# Patient Record
Sex: Male | Born: 1955 | Race: Black or African American | Hispanic: No | Marital: Married | Smoking: Current every day smoker
Health system: Southern US, Community
[De-identification: ages and names within clinical notes are randomized; demographics above are authoritative.]

## PROBLEM LIST (undated history)

## (undated) DIAGNOSIS — I639 Cerebral infarction, unspecified: Secondary | ICD-10-CM

---

## 2008-09-19 ENCOUNTER — Inpatient Hospital Stay: Payer: Self-pay | Admitting: Internal Medicine

## 2011-09-25 ENCOUNTER — Ambulatory Visit: Payer: Self-pay | Admitting: Oncology

## 2011-10-05 ENCOUNTER — Inpatient Hospital Stay: Payer: Self-pay | Admitting: Student

## 2011-10-05 LAB — URINALYSIS, COMPLETE
Bacteria: NONE SEEN
Protein: NEGATIVE
Squamous Epithelial: <1
WBC UR: <1 /HPF (ref 0–5)

## 2011-10-05 LAB — COMPREHENSIVE METABOLIC PANEL
Alkaline Phosphatase: 97 U/L (ref 50–136)
Anion Gap: 9 (ref 7–16)
BUN: 10 mg/dL (ref 7–18)
Bilirubin,Total: 0.2 mg/dL (ref 0.2–1.0)
Chloride: 103 mmol/L (ref 98–107)
Co2: 25 mmol/L (ref 21–32)
Glucose: 112 mg/dL — ABNORMAL HIGH (ref 65–99)
Osmolality: 274 (ref 275–301)
SGOT(AST): 24 U/L (ref 15–37)
Total Protein: 7.6 g/dL (ref 6.4–8.2)

## 2011-10-05 LAB — CBC
HCT: 40 % (ref 40.0–52.0)
HGB: 12.6 g/dL — ABNORMAL LOW (ref 13.0–18.0)
MCHC: 31.4 g/dL — ABNORMAL LOW (ref 32.0–36.0)
Platelet: 280 10*3/uL (ref 150–440)
RBC: 4.99 10*6/uL (ref 4.40–5.90)
RDW: 15.9 % — ABNORMAL HIGH (ref 11.5–14.5)
WBC: 12.3 10*3/uL — ABNORMAL HIGH (ref 3.8–10.6)

## 2011-10-05 LAB — PROTIME-INR
INR: 0.9
Prothrombin Time: 12.4 secs (ref 11.5–14.7)

## 2011-10-05 LAB — TROPONIN I: Troponin-I: <0.02 ng/mL

## 2011-10-06 LAB — CBC WITH DIFFERENTIAL/PLATELET
Basophil %: 0.2 %
Eosinophil %: 0.1 %
HCT: 39.4 % — ABNORMAL LOW (ref 40.0–52.0)
Lymphocyte #: 1.7 10*3/uL (ref 1.0–3.6)
Lymphocyte %: 23.7 %
MCH: 25.9 pg — ABNORMAL LOW (ref 26.0–34.0)
MCHC: 32.7 g/dL (ref 32.0–36.0)
Monocyte #: 0.1 x10 3/mm — ABNORMAL LOW (ref 0.2–1.0)
Neutrophil #: 5.3 10*3/uL (ref 1.4–6.5)
Neutrophil %: 75.3 %
Platelet: 266 10*3/uL (ref 150–440)
RBC: 4.99 10*6/uL (ref 4.40–5.90)
RDW: 16.1 % — ABNORMAL HIGH (ref 11.5–14.5)

## 2011-10-06 LAB — COMPREHENSIVE METABOLIC PANEL
Alkaline Phosphatase: 88 U/L (ref 50–136)
Calcium, Total: 9.4 mg/dL (ref 8.5–10.1)
Chloride: 107 mmol/L (ref 98–107)
Co2: 24 mmol/L (ref 21–32)
Creatinine: 0.62 mg/dL (ref 0.60–1.30)
EGFR (Non-African Amer.): >60
Glucose: 133 mg/dL — ABNORMAL HIGH (ref 65–99)
Osmolality: 282 (ref 275–301)
Potassium: 4.1 mmol/L (ref 3.5–5.1)
SGOT(AST): 15 U/L (ref 15–37)
SGPT (ALT): 34 U/L
Sodium: 141 mmol/L (ref 136–145)
Total Protein: 7.4 g/dL (ref 6.4–8.2)

## 2011-10-06 LAB — HEMOGLOBIN A1C: Hemoglobin A1C: 6.2 % (ref 4.2–6.3)

## 2011-10-07 LAB — PROTIME-INR: Prothrombin Time: 13.9 secs (ref 11.5–14.7)

## 2011-10-07 LAB — APTT
Activated PTT: 36.6 secs — ABNORMAL HIGH (ref 23.6–35.9)
Activated PTT: 51.9 secs — ABNORMAL HIGH (ref 23.6–35.9)

## 2011-10-07 LAB — LIPID PANEL
Cholesterol: 184 mg/dL (ref 0–200)
Ldl Cholesterol, Calc: 135 mg/dL — ABNORMAL HIGH (ref 0–100)

## 2011-10-08 LAB — PLATELET COUNT: Platelet: 243 10*3/uL (ref 150–440)

## 2011-10-08 LAB — PROTIME-INR: INR: 1.1

## 2011-10-08 LAB — HEMOGLOBIN: HGB: 11.7 g/dL — ABNORMAL LOW (ref 13.0–18.0)

## 2011-10-08 LAB — APTT: Activated PTT: 100 secs — ABNORMAL HIGH (ref 23.6–35.9)

## 2011-10-09 LAB — APTT: Activated PTT: 157.5 secs — ABNORMAL HIGH (ref 23.6–35.9)

## 2011-10-26 ENCOUNTER — Ambulatory Visit: Payer: Self-pay | Admitting: Oncology

## 2013-07-20 IMAGING — CT CT HEAD WITHOUT CONTRAST
2 of 3 series · 16 of 30 positions shown, 19 images · non-contrast
Comparison: none

REASON FOR EXAM: slurred speech, right arm weakness
COMMENTS:

[Series 2: without · axial · non-contrast · 0.40mm/px · z∈[-100,-5]mm · 6 of 31 slices shown]
[im 4/31  brain]
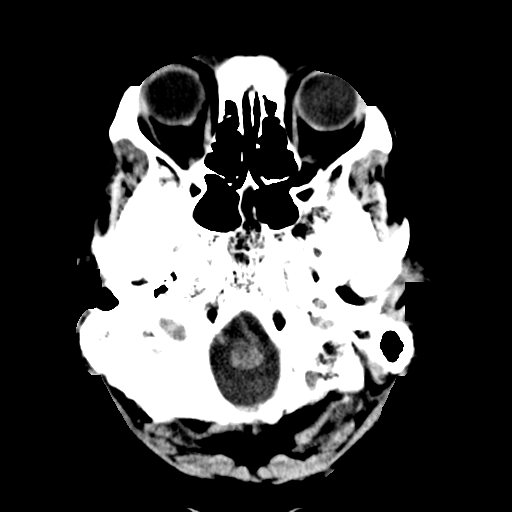
[im 8/31  brain]
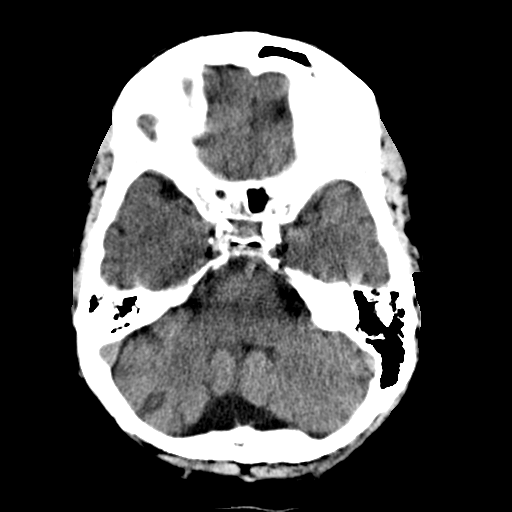
[im 12/31  brain]
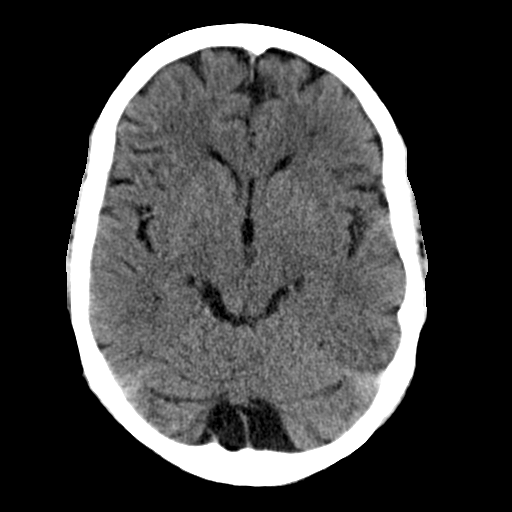
[im 16/31  brain]
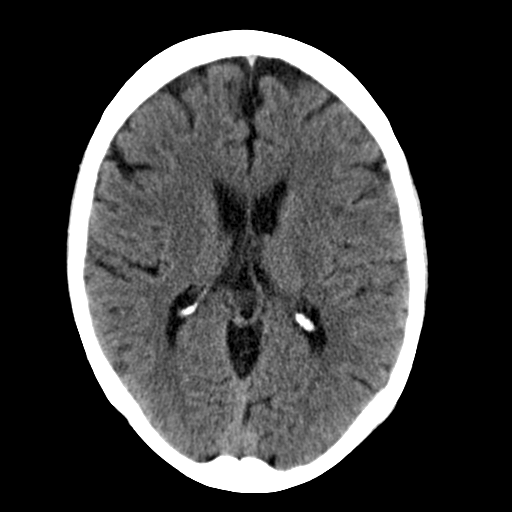
[im 19/31  brain]
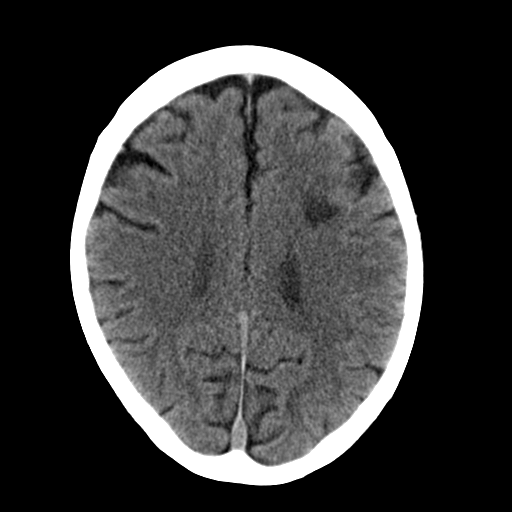
[im 23/31  brain]
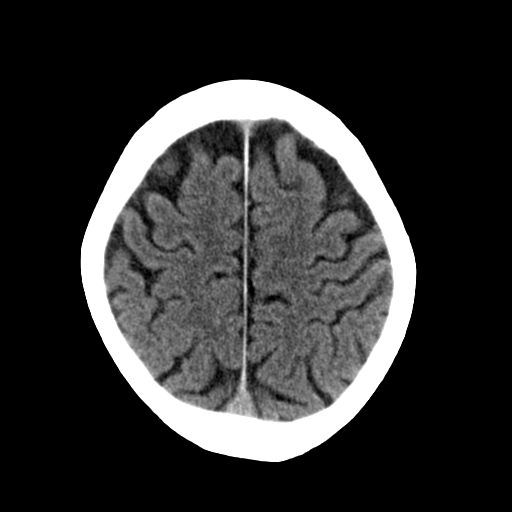

[Series 4: without 2 · axial · non-contrast · 0.40mm/px · z∈[-170,-24]mm · 10 of 39 slices shown, 13 images]
[im 4/39  brain]
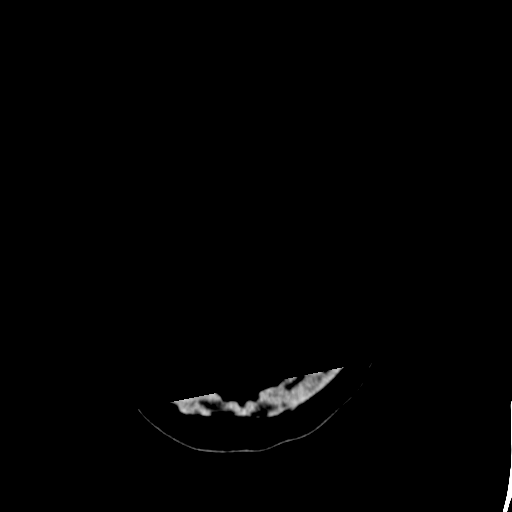
[im 4/39  bone]
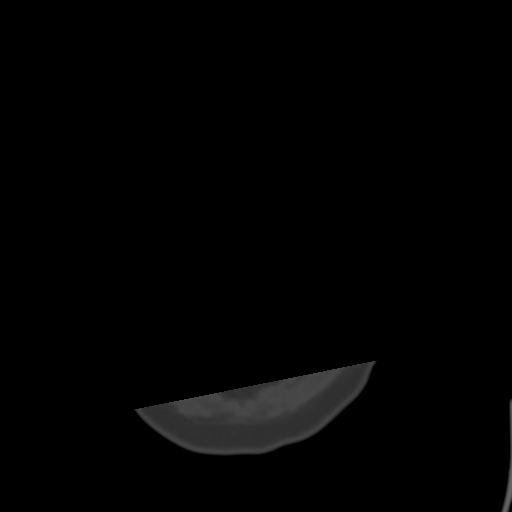
[im 7/39  brain]
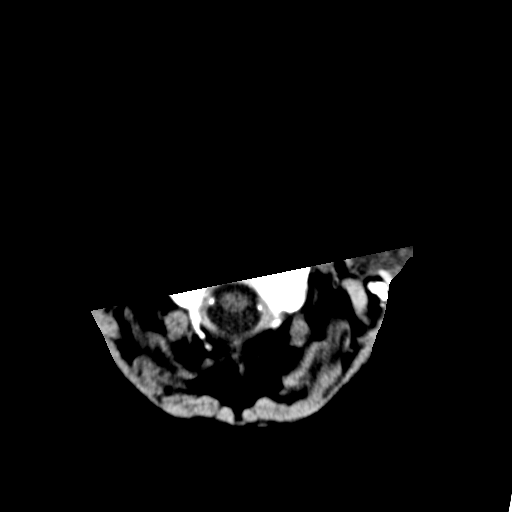
[im 11/39  brain]
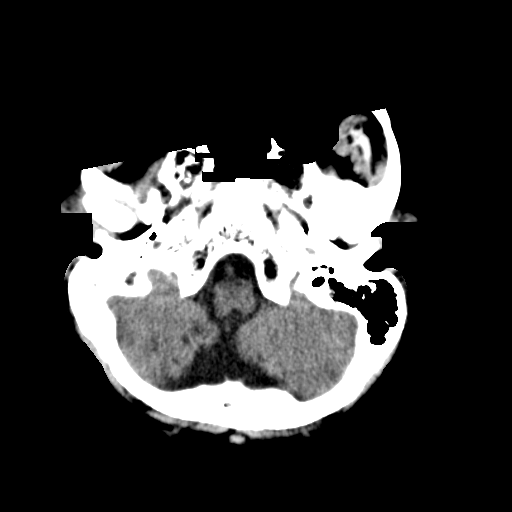
[im 14/39  brain]
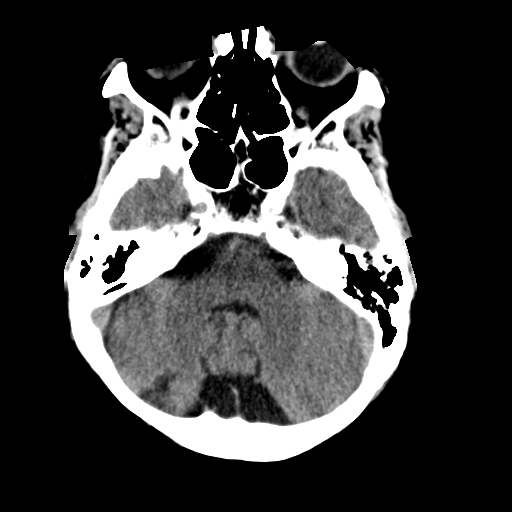
[im 18/39  brain]
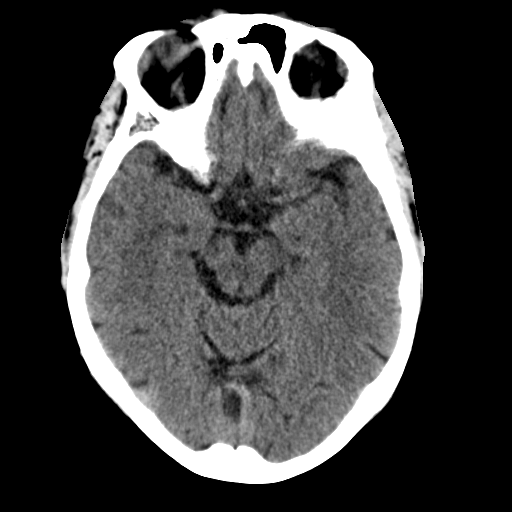
[im 18/39  bone]
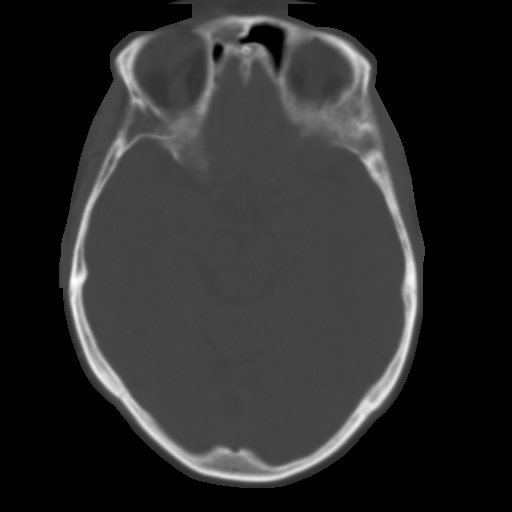
[im 21/39  brain]
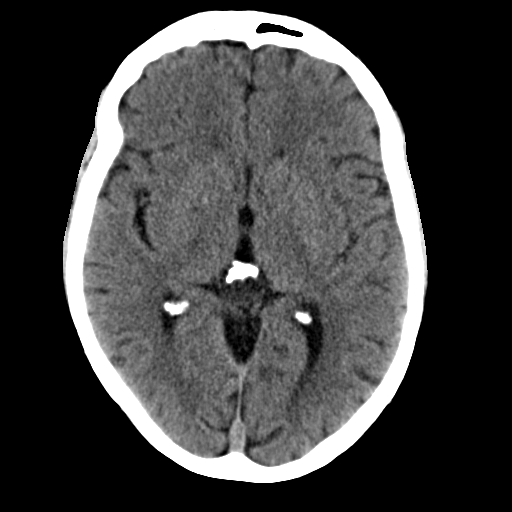
[im 25/39  brain]
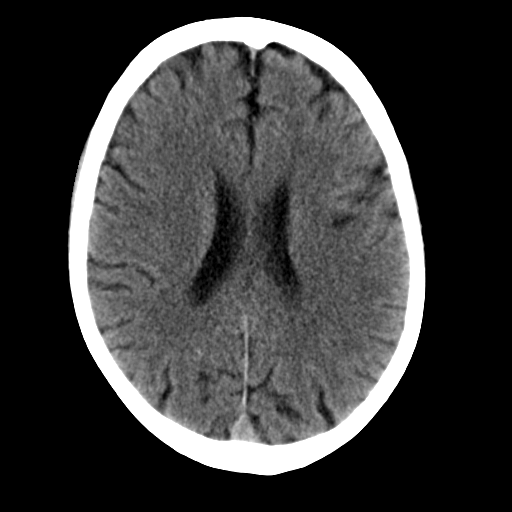
[im 28/39  brain]
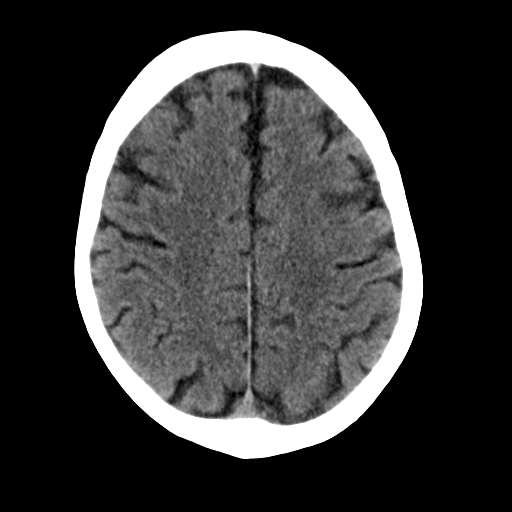
[im 32/39  brain]
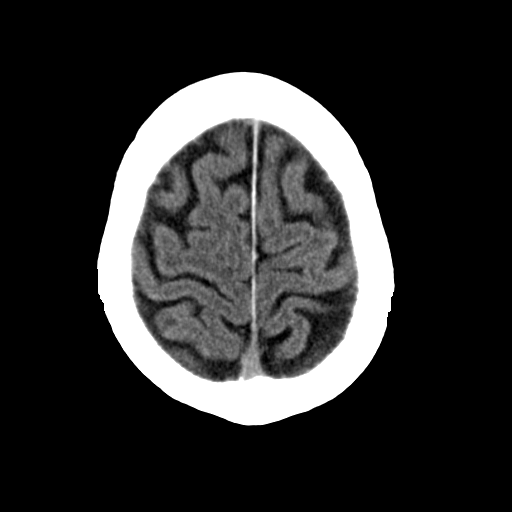
[im 32/39  bone]
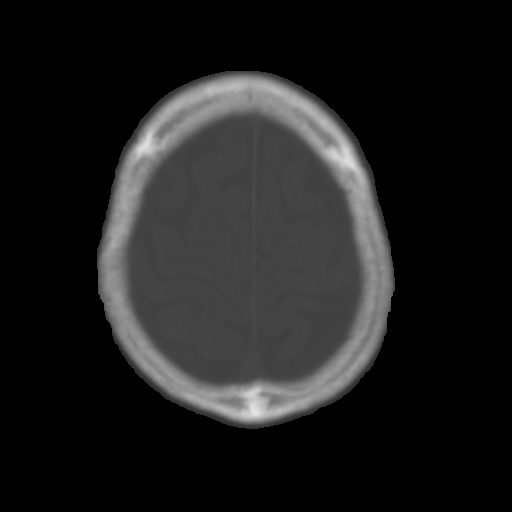
[im 35/39  brain]
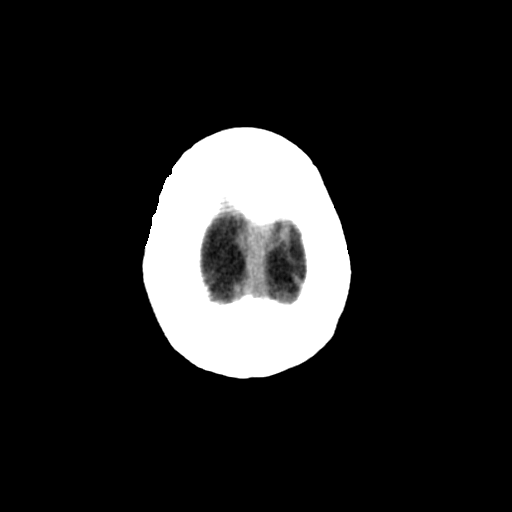

[16 of 30 positions shown; findings below may reference images not displayed]

PROCEDURE:     CT  - CT HEAD WITHOUT CONTRAST  - October 05, 2011  [DATE]

RESULT:     Emergent noncontrast CT of the brain is performed. There is no
previous exam for comparison.

There is an abnormal appearance in the left frontal region posteriorly
between image 17 and 20 concerning for underlying mass with edema. Minimal
hyperdensity is seen. Malignancy is not excluded. Definite hemorrhage is not
appreciated. There is no Max midline shift evident. The ventricles and sulci
otherwise appear normal. The sinuses and mastoid air cells show normal
aeration of the sinuses and left mastoid with some sclerosis of chronic
nature in the right mastoid. The calvarium appears intact.
IMPRESSION: 1. Findings concerning for left frontal tumor with surrounding edema. No
midline shift, definite hemorrhage or hydrocephalus evident.

[REDACTED]

## 2014-09-18 NOTE — Discharge Summary (Signed)
PATIENT NAME:  Mark Fleming, Mark Fleming MR#:  [id number] DATE OF BIRTH:  September 22, 1955  DATE OF ADMISSION:  10/05/2011 DATE OF DISCHARGE:  10/09/2011  PRIMARY CARE PHYSICIAN: None.   CONSULTANTS:  1. Dr. Grayland Ormond from [facility]. 2. Dr. Lucky Cowboy, vascular surgery.  3. Physical therapy.   CHIEF COMPLAINT: Confusion and inability to communicate.   DISCHARGE DIAGNOSES:  1. Expressive aphasia in setting of acute cerebrovascular accident in setting of left internal carotid artery stenosis.  2. Tobacco abuse.  3. History of prostate cancer diagnosed about eight years ago.  4. History of distant alcohol abuse.   DISCHARGE MEDICATIONS:  1. Warfarin 4 mg daily.  2. Simvastatin 20 mg daily.   DISPOSITION: Home.   ACTIVITY: As tolerated.   DIET: Low sodium, ADA diet.   FOLLOW UP: [facility] follow up with [facility] for [facility] check on Friday, 10/11/2011, and INR goal should be between 2 and 3. Please follow up with vascular surgery in 4 to 6 weeks for your carotid artery stenosis ongoing monitoring and follow and if there is further symptoms please call 911.   CODE STATUS: Patient is FULL CODE.   HISTORY OF PRESENT ILLNESS: For full details, please see the history and physical dictated on 10/05/2011 by Dr. Ether Griffins, but briefly this is a 59 year old African American male who has not seen a doctor for five years presents after having difficulty expressing himself. Initial CT of the head had shown left frontal area possible mass and patient was admitted to the hospitalist service for work-up for this mass. Patient apparently has history of prostate cancer. An MRI of the brain was ordered as well as patient was given Decadron and admitted to the hospitalist service.   LABORATORY, DIAGNOSTIC AND RADIOLOGICAL DATA: Initial creatinine 0.79, glucose 112, BUN 10. LFTs within normal and limits. Troponin negative, hemoglobin 12.6. WBC 12.3, hematocrit 40, platelets 280. INR 0.9. Urinalysis negative. PSA is elevated at 9.4. INR  by discharge today was 2. Occult stool negative. Echocardiogram showing LV systolic function of 97%, LA is mildly dilated as well as right atrium, mild to moderate mitral regurgitation and tricuspid regurgitation. No apparent source for the cerebrovascular accident. CT of the head without contrast on arrival showing findings concerning for left frontal tumor with surrounding edema, no midline shift. Definitive hemorrhage or hydrocephalus evident. CT of the chest, abdomen and pelvis with contrast on 05/12 shows cholelithiasis, dependent atelectasis in both lower lobes and atherosclerotic disease. CT angio of the neck on 05/13 shows complete occlusion of the majority of left internal carotid artery. No hemodynamically significant stenosis in the right common or internal carotid arteries. X-ray of the chest, PA and lateral on arrival: No acute cardiopulmonary disease. MRI of the brain with and without contrast on 05/12 had shown findings suggesting multifocal infarct in the left frontal and parietal regions. There are also changes of what is likely chronic small vessel disease. No midline shift. No definitive hemorrhage. A definitive enhancing mass is not appreciated. Ultrasound of the carotids on 05/12 shows finding concerning for high grade stenosis and subtotal occlusion in the left internal carotid artery.   HOSPITAL COURSE: Patient was admitted to the hospitalist service for further brain mass work-up and started on Decadron, Keppra and oncology consult was requested. MRI was ordered. Patient had no significant elevation of the blood pressure. Patient was noted to have expressive aphasia and some confusion, however, otherwise neurologically intact. The MRI of the brain did not show any masses, however, did show multiple infarcts.  Speech therapy as well as physical therapy was consulted and Decadron as well as Keppra was discontinued and vascular surgery was consulted as patient had significant carotid artery  stenosis as described above and ultrasound and work-up for the CVA. He was also put on the tele monitor and echocardiogram was obtained. Both of them were negative for acute sources of the CVA. The likely source is the carotid artery stenosis. Per vascular he needs six months of anticoagulation followed by antiplatelets and close monitoring of the carotid system bilaterally. His LDL is above goal and he was therefore started on a statin. He was started on heparin drip and Coumadin. Currently his INR is 2 and pharmacy has been dosing the Coumadin. He was on 10 mg of Coumadin and now as INR jumped from low 1s to 2 the dose was then decreased to 4. Patient aware that he should have INR checked on Friday and his INR goal should be between 2 and 3 and needs ongoing monitoring of his blood count. He also is aware that if he has any further symptoms he is to call 911. He has significantly elevated risk of recurrent strokes and he is aware of that as well.   He was seen by physical therapy and their recommendation is home. He will be going home with outpatient follow up at the [place]. In regards to his PCA, PCA is elevated and needs outpatient follow up as well and he was also started on a nicotine patch here. His hemoglobin A1c is 6.1. He had mild leukocytosis which did trend down and did not need any further work-up for that. At this point he will be discharged with above follow up.   DISPOSITION: Home.   TOTAL TIME SPENT: 40 minutes.   CODE STATUS: Patient is FULL CODE.  ____________________________ Vivien Presto, MD sa:cms D: 10/09/2011 11:40:56 ET T: 10/09/2011 12:40:02 ET JOB#: [id number]  cc: Vivien Presto, MD, <Dictator>  Vivien Presto MD ELECTRONICALLY SIGNED 10/11/2011 16:32

## 2014-09-18 NOTE — H&P (Signed)
PATIENT NAME:  Mark Fleming, Mark Fleming MR#:  [id number] DATE OF BIRTH:  1955-10-09  DATE OF ADMISSION:  10/05/2011  ADDENDUM:  The patient mentioned right lower extremity cramping. It is very unclear why he would be having these cramps. However, I am concerned about possible partial seizures and I will be initiating the patient on Keppra starting now, 500 mg twice daily dose. The patient may benefit from electroencephalographic evaluation in the future.    ____________________________ Theodoro Grist, MD rv:bjt D: 10/05/2011 22:56:36 ET T: 10/06/2011 09:48:42 ET JOB#: [id number]  cc: Theodoro Grist, MD, <Dictator> Makynzi Eastland MD ELECTRONICALLY SIGNED 10/26/2011 15:16

## 2014-09-18 NOTE — Consult Note (Signed)
PATIENT NAME:  Mark Fleming, Mark Fleming MR#:  [id number] DATE OF BIRTH:  February 26, 1956  DATE OF CONSULTATION:  10/07/2011  CONSULTING PHYSICIAN:  Algernon Huxley, MD  REASON FOR CONSULTATION: Carotid disease and stroke.  HISTORY OF PRESENT ILLNESS: The patient is a 59 year old African American male with history prostate cancer who was admitted with aphasia, some right-sided pain and weakness, confusion, and was initially felt to have potential brain mass.  During his work-up, he has had an MRI of the brain which did not reveal a brain mass but instead multiple areas of infarct in the left hemisphere. This was followed by an ultrasound which suggested a near occlusive stenosis in the left internal carotid artery. He was started on heparin last night when these findings returned and has resultant improvement in his aphasia, although not complete resolution. He does not have appreciable weakness in his right side at this point, and his mental status is improved. A CT angiogram was performed this morning which I have independently reviewed. This clearly suggests occlusion of the left internal carotid artery. There is minimal disease in the right common carotid artery present.   PAST MEDICAL HISTORY: Significant for: 1. Prostate cancer eight years ago.  2. Tobacco abuse.  3. Right foot abscesses.  4. History of alcohol abuse.   PAST SURGICAL HISTORY: He had some sort of implant for prostate cancer, and that is apparently it. He did not have prostatectomy.   MEDICATIONS: None on admission.   ALLERGIES: No known drug allergies.   FAMILY HISTORY: Negative for coronary artery disease or stroke.   SOCIAL HISTORY: Ongoing tobacco abuse of 1 to 2 packs per day. He quit alcohol approximately five years ago but had drank heavily prior to that. He is currently unemployed.  REVIEW OF SYSTEMS: CONSTITUTIONAL: Some weight gain over the past several years. No fevers or chills. EYES: He had some blurry vision and visual  changes with this event. EARS: No tinnitus or ear pain. CARDIOVASCULAR: No chest pain or palpitations. RESPIRATORY: No shortness of breath or cough. GASTROINTESTINAL: No nausea, vomiting, diarrhea. GENITOURINARY: No dysuria or hematuria. ENDOCRINE: No heat or cold intolerance. HEMATOLOGIC: No anemia or easy bruising. SKIN: No new rash or ulcers. NEUROLOGICAL:  As per history of present illness. PSYCHIATRIC: No anxiety or depression.   PHYSICAL EXAMINATION:  GENERAL: This is a well-developed, well-nourished [place] American male, sitting in his bed, not in apparent distress. He has mild aphasia but this is not severe, and I did not appreciate any facial droop.   VITAL SIGNS: Temperature 98, pulse 78, blood pressure 102/62, saturations are 96% on room air.   HEENT: Head: Normocephalic and atraumatic. Eyes: Sclerae are anicteric. Conjunctivae are clear. Ears: Normal external appearance. Hearing is intact.   NECK: Neck is supple without adenopathy or JVD. Carotids: I do not appreciate any bruits on exam today.   HEART: Regular rate and rhythm.   LUNGS: Clear to auscultation bilaterally.   ABDOMEN: Soft, nondistended, nontender.   EXTREMITIES: He did not have any appreciable strength deficits or sensation deficits of the upper or lower extremities that I can tell on exam today.   NEUROLOGICAL: Strength and tone are intact. He does have some mild aphasia present. Mental status seems reasonably good, and he is alert and oriented.   PSYCHIATRIC: Normal affect and mood.     LABORATORY, DIAGNOSTIC AND RADIOLOGICAL DATA:  INR 1.0. Total cholesterol is 184 with an HDL of 32 and an LDL of 135.  Sodium was 141, potassium  4.1, chloride 107, CO2 24, BUN 10, creatinine 0.62 glucose is 133. White blood cell count is 7.0, hemoglobin 12.9, platelet count 266,000. Imaging studies as described above.   ASSESSMENT AND PLAN: The patient is a 59 year old African American male with acute stroke secondary to a  left carotid artery occlusion. This is likely an acute occlusion, and I would recommend treatment with full anticoagulation for six months. This could be transitioned from heparin to Coumadin or Lovenox to Coumadin depending on the primary service's preference and the patient's comfort level with home injections. At six months, we would likely transition him to antiplatelets. He will need ongoing Vascular followup to follow his right carotid artery, which has minimal disease at present. There is no acute surgical or interventional procedure that is likely to be of benefit for his acute left carotid artery occlusion at this point. This was discussed with the patient and his wife, who voiced their understanding. I will plan on seeing him in the office in 4 to 6 weeks in followup and arranging long-term carotid surveillance. He will need his primary care physician to follow his anticoagulation status.   This is a level-4 consultation.   ____________________________ Algernon Huxley, MD jsd:cbb D: 10/07/2011 15:27:11 ET T: 10/08/2011 08:47:05 ET JOB#: [id number]  cc: Algernon Huxley, MD, <Dictator> Algernon Huxley MD ELECTRONICALLY SIGNED 10/15/2011 12:10

## 2014-09-18 NOTE — Consult Note (Signed)
Asked to see patient for left hemispheric stroke symptoms and carotid disease.  Admitted with aphasia, confusion, weakness.  This has improved but not entirely resolved with heparin started last night.  Initially there was concern for a mass lesion, but MRI showed stroke and not a mass.  His duplex showed what was reported as a near occlusion of the left ICA.  CT angiogram performed this morning which I have independently reviewed.  This clearly shows the left ICA to be occluded.  treatment for symptomatic acute ICA occlusion is with anti-coagulation alone.  There is basically no role for surgical or interventional therapy at this point.  his right carotid has minimal disease.  Will arrange outpatient follow up in 4-6 weeks with planned duplex follow up in about 6 months.  After 6 months of coumadin, will use anti-platelets.    Electronic Signatures: Algernon Huxley (MD)  (Signed on 13-May-13 14:24)  Authored  Last Updated: 13-May-13 14:24 by Algernon Huxley (MD)

## 2014-09-18 NOTE — Consult Note (Signed)
History of Present Illness:   Reason for Consult Brain mass, suspicious for malignancy.    HPI   Patient is a 59 year old male with no significant past medical history who was in his usual state of health until several days ago when her his wife he could no longer communicate intelligently to her.  He denies any pain.  He denies any focal symptoms or weakness.  He has no other neurologic complaints.  He has had no recent fevers or illnesses. He denies any chest pain, cough, hemoptysis, or shortness of breath.  He is good appetite and denies weight loss.  He has no nausea, vomiting, constipation, or diarrhea.  He has no melena or hematochezia.  He has no urologic complaints.  Patient otherwise feels well and offers no further specific complaints.  PFSH:   Additional Past Medical and Surgical History Past medical history: Diabetes, prostate cancer.  Past surgical history: Negative.  Family history: Negative and noncontributory.  Social history: Positive tobacco, one pack per day x25 years, former alcohol none currently though.   Review of Systems:   Performance Status (ECOG) 0    Review of Systems   As per HPI. Otherwise, 10 point system review was negative.   NURSING NOTES: **Vital Signs.:   12-May-13 05:29    Vital Signs Type: Routine    Temperature Temperature (F): 97.9    Celsius: 36.6    Temperature Source: oral    Pulse Pulse: 80    Pulse source: per Dinamap    Respirations Respirations: 18    Systolic BP Systolic BP: 409    Diastolic BP (mmHg) Diastolic BP (mmHg): 71    Mean BP: 86    BP Source: Dinamap    Pulse Ox % Pulse Ox %: 93    Pulse Ox Activity Level: At rest    Oxygen Delivery: Room Air/ 21 %   Physical Exam:   Physical Exam General: Well-developed, well-nourished, no acute distress. Eyes: Pink conjunctiva, anicteric sclera. HEENT: Normocephalic, moist mucous membranes, clear oropharnyx. Lungs: Clear to auscultation bilaterally. Heart:  Regular rate and rhythm. No rubs, murmurs, or gallops. Abdomen: Soft, nontender, nondistended. No organomegaly noted, normoactive bowel sounds. Musculoskeletal: No edema, cyanosis, or clubbing. Neuro: Alert, answering all questions appropriately. Cranial nerves grossly intact. Skin: No rashes or petechiae noted. Psych: Normal affect. Lymphatics: No cervical, calvicular, axillary or inguinal LAD.    No Known Allergies:   Routine Hem:  12-May-13 04:50    WBC (CBC) 7.0   RBC (CBC) 4.99   Hemoglobin (CBC) 12.9   Hematocrit (CBC) 39.4   Platelet Count (CBC) 266   MCV 79   MCH 25.9   MCHC 32.7   RDW 16.1  Routine Chem:  12-May-13 04:50    Glucose, Serum 133   BUN 10   Creatinine (comp) 0.62   Sodium, Serum 141   Potassium, Serum 4.1   Chloride, Serum 107   CO2, Serum 24   Calcium (Total), Serum 9.4  Hepatic:  12-May-13 04:50    Bilirubin, Total 0.3   Alkaline Phosphatase 88   SGPT (ALT) 34   SGOT (AST) 15   Total Protein, Serum 7.4   Albumin, Serum 3.6  Routine Chem:  12-May-13 04:50    Osmolality (calc) 282   eGFR (African American) >60   eGFR (Non-African American) >60   Anion Gap 10  Routine Hem:  12-May-13 04:50    Neutrophil % 75.3   Lymphocyte % 23.7   Monocyte % 0.7   Eosinophil %  0.1   Basophil % 0.2   Neutrophil # 5.3   Lymphocyte # 1.7   Monocyte # 0.1   Eosinophil # 0.0   Basophil # 0.0   Assessment and Plan:  Impression:   Brain mass, highly suspicious for malignancy.  Plan:   1.  Brain mass: Unclear if this is the primary tumor or possibly metastatic disease.  Have ordered a CT of the chest, abdomen, and pelvis for further investigation.  Continue Decadron 10 mg IV every 6 hours.  If this is primary brain, patient can have outpatient evaluation with Dr. Alric Seton later this week.  If a primary is found on CT scan, he will likely require a CT-guided biopsy for diagnosis. consult, will follow.  Electronic Signatures: Delight Hoh (MD)   (Signed 12-May-13 10:05)  Authored: HISTORY OF PRESENT ILLNESS, PFSH, ROS, NURSING NOTES, PE, ALLERGIES, LABS, ASSESSMENT AND PLAN   Last Updated: 12-May-13 10:05 by Delight Hoh (MD)

## 2014-09-18 NOTE — H&P (Signed)
PATIENT NAME:  Mark Fleming, Mark Fleming MR#:  [id number] DATE OF BIRTH:  Apr 23, 1956  DATE OF ADMISSION:  10/05/2011  PRIMARY CARE PHYSICIAN: None    HISTORY OF PRESENT ILLNESS: Patient is a 59 year old African American male with past medical history significant for history of prostate carcinoma status post some therapy eight years ago, however, did not finish his therapy, but no surgery was done, history of tobacco and alcohol abuse presented to the hospital with complaints of difficulty expressing himself. According to patient's wife as well as patient himself over the past few days he has been having problems expressing himself. He knows what he wants to say but he is not able to communicate. He also seemed to be confused. He also admits of having some cramping in his right lower extremity. No other symptoms were noted. No weakness, numbness or any other symptoms. He denied any visual symptoms and denied any swelling problems. He had CT scan here in the Emergency Room which showed left frontal area possible mass and hospitalist services were contacted for admission.   PAST MEDICAL HISTORY:  1. History of prostate carcinoma diagnosed approximately eight years ago, received some therapy, unknown, but no surgeries were performed. 2. Ongoing tobacco abuse. 3. History of alcohol abuse in the past.  4. History of right foot abscess and admission for the same in April 2010.    MEDICATIONS: None.   PAST SURGICAL HISTORY: None. Apparently patient received some upper extremity implant for prostate carcinoma.   ALLERGIES: No known drug allergies.   FAMILY HISTORY: Negative for coronary artery disease or myocardial infarction.   SOCIAL HISTORY: Ongoing tobacco abuse, 1 to 2 packs a day for the past close to 40 years. Denies any alcohol, quit approximately five years ago. He used to drink heavy alcohol in the past. No illicit drugs. Lives in [place] with his wife. Has one child. Is unemployed.    REVIEW OF  SYSTEMS: CONSTITUTIONAL: Positive for weight gain, approximately 20 pounds since at least 10 years ago, now seemed to be exacerbating over the past six months. Quite good p.o. intake. Right eye blurring of vision, patient admits of that, otherwise denies any other symptoms. A couple of time he had bloodshot in his right eye. Otherwise denies fevers, chills, fatigue, weakness, weight loss. Admits of gain. EYES: In regards to eyes denies any blurry vision, double vision, glaucoma, cataracts. ENT: Denies any tinnitus, allergies, epistaxis, sinus pain, dentures, difficulty swallowing. RESPIRATORY: Denies any cough, wheezing, asthma, chronic obstructive pulmonary disease. CARDIOVASCULAR: Denies chest pain, orthopnea, edema, arrhythmia, palpitations or syncope. GASTROINTESTINAL: Denies nausea, vomiting, diarrhea, constipation. GENITOURINARY: Denies dysuria, hematuria, frequency, incontinence. ENDOCRINOLOGY: Denies any polydipsia, nocturia, thyroid problems, heat or cold intolerance, or thirst. HEME: Denies any anemia, easy bruising, bleeding, swollen glands. SKIN: Denies any acne, rash, lesions, change in moles. MUSCULOSKELETAL: Denies arthritis, cramps, swelling, gout. NEUROLOGICAL: Denies any numbness, epilepsy, tremor. PSYCH: Denies anxiety, insomnia, depression.     PHYSICAL EXAMINATION:  VITAL SIGNS: On arrival to the hospital patient's vitals: Temperature 98, pulse rate 96, respiration rate 22, blood pressure 160/96, saturation 96% on room air.   GENERAL: This is a well-nourished African American male in no significant distress, somewhat discouraged and tearful sitting on the stretcher.   HEENT: Pupils are equal, reactive to light. Extraocular movements are intact. No icterus or conjunctivitis. Has normal hearing. No pharyngeal erythema. Mucosa is moist.   NECK: Neck did not reveal any masses. Supple, nontender. Thyroid not enlarged. No adenopathy. No JVD or carotid  bruits bilaterally. Full range of motion.    LUNGS: Clear to auscultation in all fields. A few rhonchi were heard, but there was no wheezing, no labored inspirations, increased effort. Somewhat diminished breath sounds but no dullness to percussion, not in overt respiratory distress.   CARDIOVASCULAR: S1, S2 appreciated. No murmurs, gallops, rubs noted. PMI not lateralized. Chest is nontender to palpation.   EXTREMITIES: 1+ pedal pulses. No lower extremity edema, calf tenderness, or cyanosis was noted.   ABDOMEN: Soft, nontender. Bowel sounds are present. No hepatosplenomegaly or masses were noted.   RECTAL: Deferred.   MUSCULOSKELETAL: Able in all extremities. No cyanosis, degenerative joint disease, or kyphosis. Gait is not tested.   SKIN: Skin did not reveal any rash, lesions, erythema, nodularity, induration. It was warm and dry to palpation.   LYMPH: No adenopathy in cervical region.   NEUROLOGICAL: Cranial nerves grossly intact. Patient did have expressive aphasia. He is alert, oriented to person and place. He is cooperative, however, has difficulty struggling with finding words and he is getting frustrated whenever the words are not coming out. Memory is not impaired. No confusion, agitation, or depression noted.   LABORATORY, DIAGNOSTIC, AND RADIOLOGICAL DATA: BMP showed glucose 112, otherwise unremarkable study. Liver enzymes were normal. Troponin less than 0.02. White blood cell count is elevated to 12.3, hemoglobin 12.6, platelet count 280. Coagulation panel unremarkable. Urinalysis: Straw clear urine, negative for glucose, bilirubin or ketones, specific gravity 1.006, pH 6.0, negative for blood, protein, nitrites or leukocyte esterase, less than 1 red blood cell as well as white blood cells, no bacteria, less than 1 epithelial cell was noted. CT scan of head without contrast showed findings concerning for left frontal tumor with surrounding edema. No midline shift, evident hemorrhage or hydrocephalus noted.   ASSESSMENT AND  PLAN:  1. Brain mass. Admit patient to medical floor. Get MRI of brain. Get chest x-ray. Start patient on Decadron, get oncology consultation.  2. History of prostate carcinoma. Get PSA.  3. Tobacco abuse. Discussed cessation for five minutes. Nicotine replacement will be initiated.  4. Anemia. Get guaiac.  5. Hyperglycemia. Get hemoglobin A1c. Start patient on Apidra since he is going to be on Decadron now.  6. Leukocytosis. No signs of infection, possibly stress related.  7. Elevated blood pressure with no diagnosed hypertension, likely stress related. Follow and initiate blood pressure medications if needed.   TIME SPENT: 50 minutes.   ____________________________ Theodoro Grist, MD rv:cms D: 10/05/2011 22:43:27 ET T: 10/06/2011 09:57:29 ET JOB#: [id number]  cc: Theodoro Grist, MD, <Dictator> Chrissie Dacquisto MD ELECTRONICALLY SIGNED 10/26/2011 15:20

## 2018-02-12 ENCOUNTER — Ambulatory Visit: Payer: Self-pay | Admitting: Podiatry

## 2018-02-19 ENCOUNTER — Ambulatory Visit (INDEPENDENT_AMBULATORY_CARE_PROVIDER_SITE_OTHER): Payer: No Typology Code available for payment source | Admitting: Podiatry

## 2018-02-19 ENCOUNTER — Encounter: Payer: Self-pay | Admitting: Podiatry

## 2018-02-19 DIAGNOSIS — E1142 Type 2 diabetes mellitus with diabetic polyneuropathy: Secondary | ICD-10-CM | POA: Diagnosis not present

## 2018-02-19 DIAGNOSIS — E1159 Type 2 diabetes mellitus with other circulatory complications: Secondary | ICD-10-CM | POA: Diagnosis not present

## 2018-02-19 DIAGNOSIS — B351 Tinea unguium: Secondary | ICD-10-CM | POA: Diagnosis not present

## 2018-02-19 DIAGNOSIS — M79674 Pain in right toe(s): Secondary | ICD-10-CM

## 2018-02-19 DIAGNOSIS — M79675 Pain in left toe(s): Secondary | ICD-10-CM

## 2018-02-19 NOTE — Progress Notes (Signed)
This patient presents to the office with chief complaint of long thick nails and diabetic feet.  This patient  says there  is  no pain and discomfort in his  feet.  This patient says there are long thick painful nails. Patient has not have nails done in years.  These nails are painful walking and wearing shoes.  Patient has no history of infection or drainage from both feet.  Patient is unable to  self treat his own nails . This patient presents  to the office today for treatment of the  long nails and a foot evaluation due to history of  diabetes.  General Appearance  Alert, conversant and in no acute stress.  Vascular  Dorsalis pedis and posterior tibial  pulses are not  palpable  bilaterally.  Capillary return is within normal limits  bilaterally. Cold feet.  bilaterally.  Neurologic  Senn-Weinstein monofilament wire test diminished   bilaterally. Muscle power within normal limits bilaterally.  Nails Thick disfigured discolored nails with subungual debris  from hallux to fifth toes bilaterally. No evidence of bacterial infection or drainage bilaterally.  Orthopedic  No limitations of motion of motion feet .  No crepitus or effusions noted.  No bony pathology or digital deformities noted.  Skin  normotropic skin with no porokeratosis noted bilaterally.  No signs of infections or ulcers noted.     Onychomycosis  Diabetes with no foot complications  IE  Debride nails x 10.  A diabetic foot exam was performed and there is  evidence of any vascular or neurologic pathology.   RTC 3 months.   Gardiner Barefoot DPM

## 2018-04-22 ENCOUNTER — Telehealth: Payer: Self-pay | Admitting: Podiatry

## 2018-04-22 NOTE — Telephone Encounter (Signed)
[place] would like to have records faxed to [phone] Attn: Danae Chen

## 2018-05-18 ENCOUNTER — Encounter: Payer: Self-pay | Admitting: Podiatry

## 2018-05-18 ENCOUNTER — Ambulatory Visit (INDEPENDENT_AMBULATORY_CARE_PROVIDER_SITE_OTHER): Payer: No Typology Code available for payment source | Admitting: Podiatry

## 2018-05-18 DIAGNOSIS — E1159 Type 2 diabetes mellitus with other circulatory complications: Secondary | ICD-10-CM | POA: Diagnosis not present

## 2018-05-18 DIAGNOSIS — M79674 Pain in right toe(s): Secondary | ICD-10-CM | POA: Diagnosis not present

## 2018-05-18 DIAGNOSIS — B351 Tinea unguium: Secondary | ICD-10-CM | POA: Diagnosis not present

## 2018-05-18 DIAGNOSIS — M79675 Pain in left toe(s): Secondary | ICD-10-CM

## 2018-05-18 NOTE — Progress Notes (Signed)
Complaint:  Visit Type: Patient returns to my office for continued preventative foot care services. Complaint: Patient states" my nails have grown long and thick and become painful to walk and wear shoes" Patient has been diagnosed with DM with no foot complications. The patient presents for preventative foot care services. No changes to ROS  Podiatric Exam: Vascular: dorsalis pedis and posterior tibial pulses are palpable bilateral. Capillary return is immediate. Temperature gradient is WNL. Skin turgor WNL  Sensorium: Normal Semmes Weinstein monofilament test. Normal tactile sensation bilaterally. Nail Exam: Pt has thick disfigured discolored nails with subungual debris noted bilateral entire nail hallux through fifth toenails Ulcer Exam: There is no evidence of ulcer or pre-ulcerative changes or infection. Orthopedic Exam: Muscle tone and strength are WNL. No limitations in general ROM. No crepitus or effusions noted. Foot type and digits show no abnormalities. Bony prominences are unremarkable. Skin: No Porokeratosis. No infection or ulcers  Diagnosis:  Onychomycosis, , Pain in right toe, pain in left toes  Treatment & Plan Procedures and Treatment: Consent by patient was obtained for treatment procedures.   Debridement of mycotic and hypertrophic toenails, 1 through 5 bilateral and clearing of subungual debris. No ulceration, no infection noted.  Return Visit-Office Procedure: Patient instructed to return to the office for a follow up visit 3 months for continued evaluation and treatment.    Kashawna Manzer DPM 

## 2018-08-17 ENCOUNTER — Ambulatory Visit: Payer: Non-veteran care | Admitting: Podiatry

## 2018-10-22 ENCOUNTER — Ambulatory Visit: Payer: Non-veteran care | Admitting: Podiatry

## 2018-11-02 ENCOUNTER — Encounter: Payer: Self-pay | Admitting: Podiatry

## 2018-11-02 ENCOUNTER — Other Ambulatory Visit: Payer: Self-pay

## 2018-11-02 ENCOUNTER — Ambulatory Visit (INDEPENDENT_AMBULATORY_CARE_PROVIDER_SITE_OTHER): Payer: No Typology Code available for payment source | Admitting: Podiatry

## 2018-11-02 VITALS — Temp 97.7°F

## 2018-11-02 DIAGNOSIS — M79675 Pain in left toe(s): Secondary | ICD-10-CM

## 2018-11-02 DIAGNOSIS — M79674 Pain in right toe(s): Secondary | ICD-10-CM | POA: Diagnosis not present

## 2018-11-02 DIAGNOSIS — B351 Tinea unguium: Secondary | ICD-10-CM

## 2018-11-02 NOTE — Progress Notes (Signed)
Complaint:  Visit Type: Patient returns to my office for continued preventative foot care services. Complaint: Patient states" my nails have grown long and thick and become painful to walk and wear shoes" . The patient presents for preventative foot care services. No changes to ROS  Podiatric Exam: Vascular: dorsalis pedis and posterior tibial pulses are palpable bilateral. Capillary return is immediate. Temperature gradient is WNL. Skin turgor WNL  Sensorium: Normal Semmes Weinstein monofilament test. Normal tactile sensation bilaterally. Nail Exam: Pt has thick disfigured discolored nails with subungual debris noted bilateral entire nail hallux through fifth toenails Ulcer Exam: There is no evidence of ulcer or pre-ulcerative changes or infection. Orthopedic Exam: Muscle tone and strength are WNL. No limitations in general ROM. No crepitus or effusions noted. Foot type and digits show no abnormalities. Bony prominences are unremarkable. Skin: No Porokeratosis. No infection or ulcers  Diagnosis:  Onychomycosis, , Pain in right toe, pain in left toes  Treatment & Plan Procedures and Treatment: Consent by patient was obtained for treatment procedures.   Debridement of mycotic and hypertrophic toenails, 1 through 5 bilateral and clearing of subungual debris. No ulceration, no infection noted.  Return Visit-Office Procedure: Patient instructed to return to the office for a follow up visit 3 months for continued evaluation and treatment.    Verona Hartshorn DPM 

## 2019-02-04 ENCOUNTER — Ambulatory Visit (INDEPENDENT_AMBULATORY_CARE_PROVIDER_SITE_OTHER): Payer: Non-veteran care | Admitting: Podiatry

## 2019-02-04 ENCOUNTER — Encounter: Payer: Self-pay | Admitting: Podiatry

## 2019-02-04 ENCOUNTER — Other Ambulatory Visit: Payer: Self-pay

## 2019-02-04 DIAGNOSIS — E1159 Type 2 diabetes mellitus with other circulatory complications: Secondary | ICD-10-CM

## 2019-02-04 DIAGNOSIS — M79674 Pain in right toe(s): Secondary | ICD-10-CM

## 2019-02-04 DIAGNOSIS — B351 Tinea unguium: Secondary | ICD-10-CM

## 2019-02-04 DIAGNOSIS — M79675 Pain in left toe(s): Secondary | ICD-10-CM | POA: Diagnosis not present

## 2019-02-04 NOTE — Progress Notes (Signed)
Complaint:  Visit Type: Patient returns to my office for continued preventative foot care services. Complaint: Patient states" my nails have grown long and thick and become painful to walk and wear shoes" . The patient presents for preventative foot care services. No changes to ROS  Podiatric Exam: Vascular: dorsalis pedis and posterior tibial pulses are palpable bilateral. Capillary return is immediate. Temperature gradient is WNL. Skin turgor WNL  Sensorium: Normal Semmes Weinstein monofilament test. Normal tactile sensation bilaterally. Nail Exam: Pt has thick disfigured discolored nails with subungual debris noted bilateral entire nail hallux through fifth toenails Ulcer Exam: There is no evidence of ulcer or pre-ulcerative changes or infection. Orthopedic Exam: Muscle tone and strength are WNL. No limitations in general ROM. No crepitus or effusions noted. Foot type and digits show no abnormalities. Bony prominences are unremarkable. Skin: No Porokeratosis. No infection or ulcers  Diagnosis:  Onychomycosis, , Pain in right toe, pain in left toes  Treatment & Plan Procedures and Treatment: Consent by patient was obtained for treatment procedures.   Debridement of mycotic and hypertrophic toenails, 1 through 5 bilateral and clearing of subungual debris. No ulceration, no infection noted.  Return Visit-Office Procedure: Patient instructed to return to the office for a follow up visit 3 months for continued evaluation and treatment.    Charie Pinkus DPM 

## 2019-05-06 ENCOUNTER — Ambulatory Visit: Payer: Non-veteran care | Admitting: Podiatry

## 2019-05-06 ENCOUNTER — Other Ambulatory Visit: Payer: Self-pay

## 2020-08-10 ENCOUNTER — Inpatient Hospital Stay: Payer: No Typology Code available for payment source

## 2020-08-10 ENCOUNTER — Emergency Department: Payer: No Typology Code available for payment source

## 2020-08-10 ENCOUNTER — Inpatient Hospital Stay
Admission: EM | Admit: 2020-08-10 | Discharge: 2020-08-12 | DRG: 065 | Disposition: A | Discharge status: Home Health | Payer: No Typology Code available for payment source | Attending: Family Medicine | Admitting: Family Medicine

## 2020-08-10 ENCOUNTER — Other Ambulatory Visit: Payer: Self-pay

## 2020-08-10 DIAGNOSIS — R471 Dysarthria and anarthria: Secondary | ICD-10-CM | POA: Diagnosis present

## 2020-08-10 DIAGNOSIS — E876 Hypokalemia: Secondary | ICD-10-CM | POA: Diagnosis present

## 2020-08-10 DIAGNOSIS — E871 Hypo-osmolality and hyponatremia: Secondary | ICD-10-CM | POA: Diagnosis present

## 2020-08-10 DIAGNOSIS — R29707 NIHSS score 7: Secondary | ICD-10-CM | POA: Diagnosis present

## 2020-08-10 DIAGNOSIS — F17210 Nicotine dependence, cigarettes, uncomplicated: Secondary | ICD-10-CM | POA: Diagnosis present

## 2020-08-10 DIAGNOSIS — D62 Acute posthemorrhagic anemia: Secondary | ICD-10-CM | POA: Diagnosis present

## 2020-08-10 DIAGNOSIS — F17200 Nicotine dependence, unspecified, uncomplicated: Secondary | ICD-10-CM | POA: Diagnosis present

## 2020-08-10 DIAGNOSIS — Z20822 Contact with and (suspected) exposure to covid-19: Secondary | ICD-10-CM | POA: Diagnosis present

## 2020-08-10 DIAGNOSIS — K922 Gastrointestinal hemorrhage, unspecified: Secondary | ICD-10-CM

## 2020-08-10 DIAGNOSIS — I639 Cerebral infarction, unspecified: Secondary | ICD-10-CM | POA: Diagnosis present

## 2020-08-10 DIAGNOSIS — Z7952 Long term (current) use of systemic steroids: Secondary | ICD-10-CM | POA: Diagnosis not present

## 2020-08-10 DIAGNOSIS — Z79899 Other long term (current) drug therapy: Secondary | ICD-10-CM

## 2020-08-10 DIAGNOSIS — D509 Iron deficiency anemia, unspecified: Secondary | ICD-10-CM | POA: Diagnosis not present

## 2020-08-10 DIAGNOSIS — C61 Malignant neoplasm of prostate: Secondary | ICD-10-CM | POA: Diagnosis present

## 2020-08-10 DIAGNOSIS — R195 Other fecal abnormalities: Secondary | ICD-10-CM | POA: Diagnosis present

## 2020-08-10 DIAGNOSIS — Z8719 Personal history of other diseases of the digestive system: Secondary | ICD-10-CM | POA: Diagnosis not present

## 2020-08-10 DIAGNOSIS — Z8546 Personal history of malignant neoplasm of prostate: Secondary | ICD-10-CM

## 2020-08-10 DIAGNOSIS — I69351 Hemiplegia and hemiparesis following cerebral infarction affecting right dominant side: Secondary | ICD-10-CM

## 2020-08-10 DIAGNOSIS — F17213 Nicotine dependence, cigarettes, with withdrawal: Secondary | ICD-10-CM | POA: Diagnosis not present

## 2020-08-10 DIAGNOSIS — I63512 Cerebral infarction due to unspecified occlusion or stenosis of left middle cerebral artery: Principal | ICD-10-CM | POA: Diagnosis present

## 2020-08-10 DIAGNOSIS — E785 Hyperlipidemia, unspecified: Secondary | ICD-10-CM | POA: Diagnosis present

## 2020-08-10 DIAGNOSIS — R531 Weakness: Secondary | ICD-10-CM

## 2020-08-10 DIAGNOSIS — R4701 Aphasia: Secondary | ICD-10-CM | POA: Diagnosis present

## 2020-08-10 DIAGNOSIS — Z7902 Long term (current) use of antithrombotics/antiplatelets: Secondary | ICD-10-CM

## 2020-08-10 DIAGNOSIS — I63232 Cerebral infarction due to unspecified occlusion or stenosis of left carotid arteries: Secondary | ICD-10-CM | POA: Diagnosis not present

## 2020-08-10 DIAGNOSIS — I10 Essential (primary) hypertension: Secondary | ICD-10-CM | POA: Diagnosis present

## 2020-08-10 DIAGNOSIS — I6529 Occlusion and stenosis of unspecified carotid artery: Secondary | ICD-10-CM

## 2020-08-10 HISTORY — DX: Cerebral infarction, unspecified: I63.9

## 2020-08-10 LAB — COMPREHENSIVE METABOLIC PANEL
ALT: 36 U/L (ref 0–44)
AST: 46 U/L — ABNORMAL HIGH (ref 15–41)
Albumin: 3.6 g/dL (ref 3.5–5.0)
Alkaline Phosphatase: 73 U/L (ref 38–126)
Anion gap: 9 (ref 5–15)
BUN: 5 mg/dL — ABNORMAL LOW (ref 8–23)
CO2: 26 mmol/L (ref 22–32)
Calcium: 9 mg/dL (ref 8.9–10.3)
Chloride: 95 mmol/L — ABNORMAL LOW (ref 98–111)
Creatinine, Ser: 0.76 mg/dL (ref 0.61–1.24)
GFR, Estimated: >60 mL/min (ref >60)
Glucose, Bld: 112 mg/dL — ABNORMAL HIGH (ref 70–99)
Potassium: 3.3 mmol/L — ABNORMAL LOW (ref 3.5–5.1)
Sodium: 130 mmol/L — ABNORMAL LOW (ref 135–145)
Total Bilirubin: 0.7 mg/dL (ref 0.3–1.2)
Total Protein: 6.9 g/dL (ref 6.5–8.1)

## 2020-08-10 LAB — CBC WITH DIFFERENTIAL/PLATELET
Abs Immature Granulocytes: 0.03 10*3/uL (ref 0.00–0.07)
Basophils Absolute: 0 10*3/uL (ref 0.0–0.1)
Basophils Relative: 1 %
Eosinophils Absolute: 0.2 10*3/uL (ref 0.0–0.5)
Eosinophils Relative: 3 %
HCT: 26.5 % — ABNORMAL LOW (ref 39.0–52.0)
Hemoglobin: 7 g/dL — ABNORMAL LOW (ref 13.0–17.0)
Immature Granulocytes: 0 %
Lymphocytes Relative: 21 %
Lymphs Abs: 1.7 10*3/uL (ref 0.7–4.0)
MCH: 17.7 pg — ABNORMAL LOW (ref 26.0–34.0)
MCHC: 26.4 g/dL — ABNORMAL LOW (ref 30.0–36.0)
MCV: 67.1 fL — ABNORMAL LOW (ref 80.0–100.0)
Monocytes Absolute: 0.8 10*3/uL (ref 0.1–1.0)
Monocytes Relative: 10 %
Neutro Abs: 5.2 10*3/uL (ref 1.7–7.7)
Neutrophils Relative %: 65 %
Platelets: 372 10*3/uL (ref 150–400)
RBC: 3.95 MIL/uL — ABNORMAL LOW (ref 4.22–5.81)
RDW: 20.3 % — ABNORMAL HIGH (ref 11.5–15.5)
Smear Review: NORMAL
WBC: 7.9 10*3/uL (ref 4.0–10.5)
nRBC: 0 % (ref 0.0–0.2)

## 2020-08-10 LAB — PROTIME-INR
INR: 1.1 (ref 0.8–1.2)
Prothrombin Time: 13.3 seconds (ref 11.4–15.2)

## 2020-08-10 LAB — SARS CORONAVIRUS 2 (TAT 6-24 HRS): SARS Coronavirus 2: NEGATIVE

## 2020-08-10 LAB — ABO/RH: ABO/RH(D): A POS

## 2020-08-10 LAB — PREPARE RBC (CROSSMATCH)

## 2020-08-10 LAB — TROPONIN I (HIGH SENSITIVITY): Troponin I (High Sensitivity): 7 ng/L (ref <18)

## 2020-08-10 LAB — HIV ANTIBODY (ROUTINE TESTING W REFLEX): HIV Screen 4th Generation wRfx: NONREACTIVE

## 2020-08-10 MED ORDER — SODIUM CHLORIDE 0.9 % IV SOLN
10.0000 mL/h | Freq: Once | INTRAVENOUS | Status: AC
  Administered 2020-08-10: 10 mL/h via INTRAVENOUS

## 2020-08-10 MED ORDER — NICOTINE 21 MG/24HR TD PT24
21.0000 mg | MEDICATED_PATCH | Freq: Every day | TRANSDERMAL | Status: DC
  Administered 2020-08-11 – 2020-08-12 (×2): 21 mg via TRANSDERMAL
  Filled 2020-08-10 (×2): qty 1

## 2020-08-10 MED ORDER — STROKE: EARLY STAGES OF RECOVERY BOOK: Freq: Once | Status: DC

## 2020-08-10 MED ORDER — VITAMIN D 25 MCG (1000 UNIT) PO TABS
2000.0000 [IU] | ORAL_TABLET | Freq: Every day | ORAL | Status: DC
  Administered 2020-08-11 – 2020-08-12 (×2): 2000 [IU] via ORAL
  Filled 2020-08-10 (×2): qty 2

## 2020-08-10 MED ORDER — ACETAMINOPHEN 650 MG RE SUPP: 650.0000 mg | RECTAL | Status: DC | PRN

## 2020-08-10 MED ORDER — SODIUM CHLORIDE 0.9 % IV SOLN: INTRAVENOUS | Status: DC

## 2020-08-10 MED ORDER — PANTOPRAZOLE SODIUM 40 MG IV SOLR
40.0000 mg | INTRAVENOUS | Status: DC
  Administered 2020-08-10: 40 mg via INTRAVENOUS
  Filled 2020-08-10: qty 40

## 2020-08-10 MED ORDER — ACETAMINOPHEN 325 MG PO TABS
650.0000 mg | ORAL_TABLET | ORAL | Status: DC | PRN
  Administered 2020-08-12: 11:00:00 650 mg via ORAL
  Filled 2020-08-10: qty 2

## 2020-08-10 MED ORDER — ABIRATERONE ACETATE 250 MG PO TABS
1000.0000 mg | ORAL_TABLET | Freq: Every day | ORAL | Status: DC
  Administered 2020-08-11: 1000 mg via ORAL
  Filled 2020-08-10: qty 4

## 2020-08-10 MED ORDER — SENNOSIDES-DOCUSATE SODIUM 8.6-50 MG PO TABS
1.0000 | ORAL_TABLET | Freq: Every evening | ORAL | Status: DC | PRN
Start: 2020-08-10 — End: 2020-08-12

## 2020-08-10 MED ORDER — ATORVASTATIN CALCIUM 20 MG PO TABS
80.0000 mg | ORAL_TABLET | Freq: Every day | ORAL | Status: DC
  Administered 2020-08-10 – 2020-08-11 (×2): 80 mg via ORAL
  Filled 2020-08-10: qty 1
  Filled 2020-08-10: qty 4

## 2020-08-10 MED ORDER — PANTOPRAZOLE SODIUM 40 MG IV SOLR
40.0000 mg | Freq: Two times a day (BID) | INTRAVENOUS | Status: DC
  Administered 2020-08-10 – 2020-08-12 (×4): 40 mg via INTRAVENOUS
  Filled 2020-08-10 (×4): qty 40

## 2020-08-10 MED ORDER — ACETAMINOPHEN 160 MG/5ML PO SOLN
650.0000 mg | ORAL | Status: DC | PRN
  Filled 2020-08-10: qty 20.3

## 2020-08-10 MED ORDER — VITAMIN B-12 1000 MCG PO TABS
1000.0000 ug | ORAL_TABLET | Freq: Every day | ORAL | Status: DC
  Administered 2020-08-11 – 2020-08-12 (×2): 1000 ug via ORAL
  Filled 2020-08-10 (×3): qty 1

## 2020-08-10 MED ORDER — PREDNISONE 10 MG PO TABS
5.0000 mg | ORAL_TABLET | Freq: Two times a day (BID) | ORAL | Status: DC
  Administered 2020-08-10 – 2020-08-12 (×4): 5 mg via ORAL
  Filled 2020-08-10 (×4): qty 1

## 2020-08-10 NOTE — ED Triage Notes (Addendum)
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

## 2020-08-10 NOTE — H&P (Addendum)
History and Physical    Mark Fleming:[id number] DOB: 01-13-1956 DOA: 08/10/2020  PCP: [facility]   Patient coming from: Home  I have personally briefly reviewed patient's old medical records in [place]  Chief Complaint: Slurred speech                                Right lower extremity weakness  HPI: Mark Fleming is a 65 y.o. male with medical history significant for CVA with right-sided hemiparesis, nicotine dependence, hypertension, dyslipidemia and prostate cancer who presents to the emergency room via EMS for evaluation of slurred speech which started 24 hours prior to his presentation to the ER.  His wife who provides most of the history stated that she noticed her speech was slurred 1 day prior to his admission the patient refused to come to the emergency room for evaluation.  She also notes that he seems to be dragging his right leg and even though he has right-sided weakness from his prior stroke this appears to be a new problem per his wife.  He is able to respond to questions but has dysarthria which his wife states is new. He denies having any blurred vision, no headache, no difficulty swallowing, no falls or any loss of consciousness. He also complains of weakness and has had an episode of hematemesis containing blood per his wife.  He is unable to tell me what the color of his stools are but states that he has not seen any bright red blood in his stools.  He denies NSAID use and only takes Tylenol for pain as needed.  Review of medications shows that patient is on prednisone and Plavix. He complains of generalized weakness but denies having any chest pain, no shortness of breath, no nausea, no vomiting, no abdominal pain, no diarrhea, no constipation, no fever, no chills, no cough, no dysuria, no nocturia, no hematuria. Labs show sodium 130, potassium 3.3, chloride 95, bicarb 26, glucose 112, BUN 5, creatinine 0.76, calcium 9.0, alkaline phosphatase 73,  albumin 3.6, AST 46, ALT 36, total protein 6.9, troponin VII, white count 7.9, hemoglobin 7.0, hematocrit 26.5, MCV 67, RDW 20, platelet count 372, PT 13.3, INR 1.1 His SARS coronavirus 2 point-of-care test is pending CT scan of the head without contrast shows no evidence of acute intracranial abnormality. Large chronic infarct within the left frontal lobe and insula (ACA and MCA vascular territories), increased in extent as compared to the brain MRI of 10/06/2011. Progressive mild generalized parenchymal atrophy and mild cerebral white matter chronic small vessel ischemic disease. Redemonstrated chronic infarcts within the right cerebellar hemisphere. Twelve-lead EKG shows sinus rhythm with PACs. RBBBR    ED Course: Patient is a 65 year old male who presents to the ER for evaluation of dysarthria and right lower extremity weakness.  He has a history of a prior stroke with right-sided hemiparesis but appears to have new neurologic symptoms.  His symptoms started 24 hours prior to arrival to the emergency room and so he was not a candidate for TPA.  Patient also noted to have a hemoglobin of 7 g/dl and is heme positive, requiring transfusion of 1 unit of packed RBC.  He will be admitted to the hospital for further evaluation.  Review of Systems: As per HPI otherwise all other systems reviewed and negative.    Past Medical History:  Diagnosis Date  . Stroke [facility])  History reviewed. No pertinent surgical history.   reports that he has been smoking cigarettes. He has been smoking about 0.50 packs per day. He has never used smokeless tobacco. He reports previous alcohol use. He reports that he does not use drugs.  No Known Allergies  History reviewed. No pertinent family history.    Prior to Admission medications   Medication Sig Start Date End Date Taking? Authorizing Provider  abiraterone acetate (ZYTIGA) 250 MG tablet Take 1,000 mg by mouth daily. Take on an empty stomach 1 hour  before or 2 hours after a meal   Yes [provider]  atorvastatin (LIPITOR) 80 MG tablet Take 80 mg by mouth at bedtime.   Yes [provider]  cholecalciferol (VITAMIN D3) 25 MCG (1000 UNIT) tablet Take 2,000 Units by mouth daily.   Yes [provider]  clopidogrel (PLAVIX) 75 MG tablet Take 75 mg by mouth at bedtime.   Yes [provider]  lisinopril (ZESTRIL) 40 MG tablet Take 20 mg by mouth daily.   Yes [provider]  predniSONE (DELTASONE) 5 MG tablet Take 5 mg by mouth in the morning and at bedtime.   Yes [provider]  vitamin B-12 (CYANOCOBALAMIN) 1000 MCG tablet Take 1,000 mcg by mouth daily.   Yes [provider]    Physical Exam: Vitals:   08/10/20 1229 08/10/20 1254 08/10/20 1330 08/10/20 1415  BP: (!) 109/56 (!) 127/45 (!) 126/55   Pulse: 72 67    Resp: 14 14 16 13   Temp: 98.6 F (37 C) 98.3 F (36.8 C)  98.2 F (36.8 C)  TempSrc: Oral Oral  Oral  SpO2: 100% 100%    Weight:      Height:         Vitals:   08/10/20 1229 08/10/20 1254 08/10/20 1330 08/10/20 1415  BP: (!) 109/56 (!) 127/45 (!) 126/55   Pulse: 72 67    Resp: 14 14 16 13   Temp: 98.6 F (37 C) 98.3 F (36.8 C)  98.2 F (36.8 C)  TempSrc: Oral Oral  Oral  SpO2: 100% 100%    Weight:      Height:          Constitutional: Alert and oriented x 3 . Not in any apparent distress. HEENT:      Head: Normocephalic and atraumatic.         Eyes: PERLA, EOMI, Conjunctivae pallor. Sclera is non-icteric.       Mouth/Throat: Mucous membranes are moist.       Neck: Supple with no signs of meningismus. Cardiovascular: Regular rate and rhythm. No murmurs, gallops, or rubs. 2+ symmetrical distal pulses are present . No JVD. No LE edema Respiratory: Respiratory effort normal .Lungs sounds clear bilaterally. No wheezes, crackles, or rhonchi.  Gastrointestinal: Soft, non tender, and non distended with positive bowel sounds.  Genitourinary: No CVA  tenderness. Musculoskeletal: Nontender with normal range of motion in all extremities. No cyanosis, or erythema of extremities. Neurologic:  Face is symmetric. Moving all extremities.  Dysarthria,  Skin: Skin is warm, dry.  No rash or ulcers Psychiatric: Mood and affect are normal   Labs on Admission: I have personally reviewed following labs and imaging studies  CBC: Recent Labs  Lab 08/10/20 0914  WBC 7.9  NEUTROABS 5.2  HGB 7.0*  HCT 26.5*  MCV 67.1*  PLT 767   Basic Metabolic Panel: Recent Labs  Lab 08/10/20 0914  NA 130*  K 3.3*  CL 95*  CO2 26  GLUCOSE 112*  BUN 5*  CREATININE 0.76  CALCIUM 9.0   GFR: Estimated Creatinine Clearance: 93.3 mL/min (by C-G formula based on SCr of 0.76 mg/dL). Liver Function Tests: Recent Labs  Lab 08/10/20 0914  AST 46*  ALT 36  ALKPHOS 73  BILITOT 0.7  PROT 6.9  ALBUMIN 3.6   No results for input(s): LIPASE, AMYLASE in the last 168 hours. No results for input(s): AMMONIA in the last 168 hours. Coagulation Profile: Recent Labs  Lab 08/10/20 0914  INR 1.1   Cardiac Enzymes: No results for input(s): CKTOTAL, CKMB, CKMBINDEX, TROPONINI in the last 168 hours. BNP (last 3 results) No results for input(s): PROBNP in the last 8760 hours. HbA1C: No results for input(s): HGBA1C in the last 72 hours. CBG: No results for input(s): GLUCAP in the last 168 hours. Lipid Profile: No results for input(s): CHOL, HDL, LDLCALC, TRIG, CHOLHDL, LDLDIRECT in the last 72 hours. Thyroid Function Tests: No results for input(s): TSH, T4TOTAL, FREET4, T3FREE, THYROIDAB in the last 72 hours. Anemia Panel: No results for input(s): VITAMINB12, FOLATE, FERRITIN, TIBC, IRON, RETICCTPCT in the last 72 hours. Urine analysis:    Component Value Date/Time   COLORURINE Straw 10/05/2011 2030   APPEARANCEUR Clear 10/05/2011 2030   LABSPEC 1.006 10/05/2011 2030   PHURINE 6.0 10/05/2011 2030   GLUCOSEU Negative 10/05/2011 2030   HGBUR Negative  10/05/2011 2030   [place] Negative 10/05/2011 2030   KETONESUR Negative 10/05/2011 2030   PROTEINUR Negative 10/05/2011 2030   NITRITE Negative 10/05/2011 2030   LEUKOCYTESUR Negative 10/05/2011 2030    Radiological Exams on Admission: CT Head Wo Contrast  Result Date: 08/10/2020 CLINICAL DATA:  Neuro deficit, acute, stroke suspected. Additional history provided: Right-sided weakness, facial droop, new onset confusion. EXAM: CT HEAD WITHOUT CONTRAST TECHNIQUE: Contiguous axial images were obtained from the base of the skull through the vertex without intravenous contrast. COMPARISON:  Brain MRI 10/06/2011.  Head CT 10/05/2011. FINDINGS: Brain: Mild cerebral and cerebellar atrophy. Large region of encephalomalacia and gliosis within the left frontal lobe and insula (ACA and MCA vascular territories), likely reflecting a chronic infarct. Infarction changes have significantly increased in extent as compared to the brain MRI of 10/06/2011. Background mild ill-defined hypoattenuation within the cerebral white matter is nonspecific, but compatible with chronic small vessel ischemic disease. Redemonstrated chronic infarcts within the right cerebellar hemisphere. There is no acute intracranial hemorrhage. No acute demarcated cortical infarct is identified. No extra-axial fluid collection. No evidence of intracranial mass. No midline shift. Mega cisterna magna. Vascular: Atherosclerotic calcifications. Skull: Normal. Negative for fracture or focal lesion. Sinuses/Orbits: Visualized orbits show no acute finding. No significant paranasal sinus disease at the imaged levels. IMPRESSION: No evidence of acute intracranial abnormality. Large chronic infarct within the left frontal lobe and insula (ACA and MCA vascular territories), increased in extent as compared to the brain MRI of 10/06/2011. Progressive mild generalized parenchymal atrophy and mild cerebral white matter chronic small vessel ischemic disease.  Redemonstrated chronic infarcts within the right cerebellar hemisphere. Electronically Signed   By: Kellie Simmering DO   On: 08/10/2020 09:50     Assessment/Plan Principal Problem:   CVA (cerebral vascular accident) [facility]) Active Problems:   Acute blood loss anemia   Nicotine dependence   Prostate cancer ([place])      Rule out CVA Patient presents for evaluation of dysarthria and worsening right lower extremity weakness He has a history of CVA with prior right-sided weakness Initial CT scan of the  head shows old infarcts and no evidence of intracerebral bleed We will obtain MRI of the brain without contrast Obtain 2D echocardiogram to assess LVEF Allow for permissive hypertension until an acute stroke is ruled out We will request PT/ST/OT evaluation Continue high intensity statin We will hold Plavix since patient has acute anemia requiring blood transfusion while awaiting MRI results We will request neurology consult for further recommendation    Acute blood loss anemia Unclear etiology.  Patient has a low MCV suggestive of iron deficiency ??  Acute gastritis versus peptic ulcer disease Patient has no abdominal pain and is on prednisone and Plavix Hold Plavix for now Place patient on Protonix 40 mg IV daily We will transfuse 1 unit of packed RBC Consult GI for further recommendation    Nicotine dependence Smoking cessation has been discussed with patient in detail We will place patient on nicotine transdermal patch 21 mg daily    Prostate cancer Continue prednisone and Zytiga  DVT prophylaxis: SCD Code Status: full code Family Communication: Greater than 50% of time was spent discussing patient's condition and plan of care with him and his wife at the bedside.  All questions and concerns have been addressed.  They verbalized understanding and agree with the plan. Disposition Plan: Back to previous home environment Consults called: Neurology/gastroenterology Status: At  the time of admission, it appears the appropriate admission status for this patient is inpatient.  This is judged to be reasonable and necessary in order to provide the required intensity of service to ensure the patient's safety given the presenting symptoms, physical exam findings and initial radiographic and laboratory data in the context of their comorbid conditions. Patient requires inpatient status due to high intensity of service, high risk for further deterioration and high frequency of surveillance required.    Collier Bullock MD [facility]     08/10/2020, 2:18 PM

## 2020-08-10 NOTE — ED Notes (Signed)
EDP at bedside  

## 2020-08-10 NOTE — ED Notes (Signed)
Pt back from MRI, resting comfortably on stretcher with wife at bedside. Denies needs at this time

## 2020-08-10 NOTE — ED Notes (Signed)
Admitting provider at bedside.

## 2020-08-10 NOTE — ED Notes (Signed)
Pt tolerating blood transfusion well. VSS at 15 minute post start. Pt and family educated on s/s of transfusion reaction and instructed to make RN aware if pt experiences any of those.

## 2020-08-10 NOTE — ED Provider Notes (Addendum)
[facility] Emergency Department Provider Note   ____________________________________________   Event Date/Time   First MD Initiated Contact with Patient 08/10/20 0900     (approximate)  I have reviewed the triage vital signs and the nursing notes.   HISTORY  Chief Complaint Strokelike symptoms and Altered Mental Status    HPI Mark Fleming is a 65 y.o. male with past medical history of hypertension, hyperlipidemia, and stroke who presents to the ED for altered mental status.  History is limited as patient currently having trouble speaking, is able to state his name and answer some yes or no questions, but otherwise repeatedly says "umm" when asked more detailed questions.  Per EMS, patient noted yesterday morning to be more confused than usual, when he is typically A&O x4 at baseline.  Family was concerned that patient was having difficulty speaking and seemed weaker than usual on his right side.  He does have baseline deficits on his right side.  Patient denies any fevers, cough, chest pain, or shortness of breath.  He is unable to state whether he has any new numbness or weakness.        Past Medical History:  Diagnosis Date  . Stroke [facility])     There are no problems to display for this patient.   History reviewed. No pertinent surgical history.  Prior to Admission medications   Medication Sig Start Date End Date Taking? Authorizing Provider  atorvastatin (LIPITOR) 40 MG tablet Take 40 mg by mouth daily.    [provider]    Allergies Patient has no known allergies.  History reviewed. No pertinent family history.  Social History Social History   Tobacco Use  . Smoking status: Current Every Day Smoker    Packs/day: 0.50    Types: Cigarettes  . Smokeless tobacco: Never Used  Substance Use Topics  . Alcohol use: Not Currently  . Drug use: Never    Review of Systems Unable to obtain secondary to altered mental status and  aphasia.  ____________________________________________   PHYSICAL EXAM:  VITAL SIGNS: ED Triage Vitals [08/10/20 0900]  Enc Vitals Group     BP      Pulse      Resp      Temp      Temp src      SpO2      Weight 160 lb (72.6 kg)     Height 5\' 9"  (1.753 m)     Head Circumference      Peak Flow      Pain Score 0     Pain Loc      Pain Edu?      Excl. in [place]?     Constitutional: Alert and oriented to person, unable to state place or time. Eyes: Conjunctivae are normal.  Pupils equal round and reactive to light bilaterally. Head: Atraumatic. Nose: No congestion/rhinnorhea. Mouth/Throat: Mucous membranes are moist. Neck: Normal ROM Cardiovascular: Normal rate, regular rhythm. Grossly normal heart sounds. Respiratory: Normal respiratory effort.  No retractions. Lungs CTAB. Gastrointestinal: Soft and nontender. No distention. Genitourinary: deferred Musculoskeletal: No lower extremity tenderness nor edema. Neurologic: Aphasic with apparent word finding difficulties.  4 out of 5 strength in right upper and lower extremities, 5 out of 5 strength in left upper and lower extremities. Skin:  Skin is warm, dry and intact. No rash noted. Psychiatric: Unable to assess.  ____________________________________________   LABS (all labs ordered are listed, but only abnormal results are displayed)  Labs  Reviewed  CBC WITH DIFFERENTIAL/PLATELET - Abnormal; Notable for the following components:      Result Value   RBC 3.95 (*)    Hemoglobin 7.0 (*)    HCT 26.5 (*)    MCV 67.1 (*)    MCH 17.7 (*)    MCHC 26.4 (*)    RDW 20.3 (*)    All other components within normal limits  COMPREHENSIVE METABOLIC PANEL - Abnormal; Notable for the following components:   Sodium 130 (*)    Potassium 3.3 (*)    Chloride 95 (*)    Glucose, Bld 112 (*)    BUN 5 (*)    AST 46 (*)    All other components within normal limits  SARS CORONAVIRUS 2 (TAT 6-24 HRS)  PROTIME-INR  URINALYSIS, COMPLETE  (UACMP) WITH MICROSCOPIC  PREPARE RBC (CROSSMATCH)  TYPE AND SCREEN  ABO/RH  TROPONIN I (HIGH SENSITIVITY)   ____________________________________________  EKG  ED ECG REPORT I, Blake Divine, the attending physician, personally viewed and interpreted this ECG.   Date: 08/10/2020  EKG Time: 9:07  Rate: 79  Rhythm: normal EKG, normal sinus rhythm, unchanged from previous tracings  Axis: Normal  Intervals:none  ST&T Change: None   PROCEDURES  Procedure(s) performed (including Critical Care):  .1-3 Lead EKG Interpretation Performed by: Blake Divine, MD Authorized by: Blake Divine, MD     Interpretation: normal     ECG rate:  70-80   ECG rate assessment: normal     Rhythm: sinus rhythm     Ectopy: none     Conduction: normal    .Critical Care Performed by: Blake Divine, MD Authorized by: Blake Divine, MD   Critical care provider statement:    Critical care time (minutes):  45   Critical care time was exclusive of:  Separately billable procedures and treating other patients and teaching time   Critical care was necessary to treat or prevent imminent or life-threatening deterioration of the following conditions:  Circulatory failure and CNS failure or compromise   Critical care was time spent personally by me on the following activities:  Discussions with consultants, evaluation of patient's response to treatment, examination of patient, ordering and performing treatments and interventions, ordering and review of laboratory studies, ordering and review of radiographic studies, pulse oximetry, re-evaluation of patient's condition, obtaining history from patient or surrogate and review of old charts   I assumed direction of critical care for this patient from another provider in my specialty: no       ____________________________________________   INITIAL IMPRESSION / ASSESSMENT AND PLAN / ED COURSE       65 year old male with past history of  hypertension, hyperlipidemia, and stroke who presents to the ED for concern for difficulty speaking and acute on chronic right-sided weakness.  On my assessment, patient appears aphasic with word finding difficulties, does have right-sided weakness, unclear if any significant change from baseline as family not currently present.  We will further assess for stroke with CT head and labs, however patient is outside the window for any intervention given he was reportedly found abnormal at 8 AM yesterday morning with last known well coming 2 nights ago.  CT head reviewed by me and shows no obvious hemorrhage, does show large area of left-sided stroke worse from prior comparison in 2013.  This could represent acute stroke and patient will require further stroke work-up.  Labs also remarkable for hemoglobin of 7, wife at bedside reports patient has been generally weak for 1  to 2 weeks now with some dark stools.  Rectal exam shows guaiac positive stool and we will transfuse 1 unit PRBCs.  Patient does take Plavix but platelet transfusion not indicated at this time given patient is hemodynamically stable.  Patient and family requesting transfer to [facility] for admission and we will initiate this process, plan for admission here at [facility] if [place] is unable to accept.  Unfortunately, there are no beds currently available at the [place].  Case discussed with neurology here to determine whether Plavix should be continued in a patient with concerns for acute stroke as well as GI bleed. Case discussed with hospitalist for admission.      ____________________________________________   FINAL CLINICAL IMPRESSION(S) / ED DIAGNOSES  Final diagnoses:  Aphasia  Generalized weakness  Gastrointestinal hemorrhage, unspecified gastrointestinal hemorrhage type     ED Discharge Orders    None       Note:  This document was prepared using Dragon voice recognition software and may include unintentional dictation errors.   Blake Divine, MD 08/10/20 1039    Blake Divine, MD 08/10/20 1331

## 2020-08-10 NOTE — Consult Note (Addendum)
Vonda Antigua, MD [address], [place], [place], [place], [address] [address], [place], [place], [place], [address] Phone: [phone]  Fax: [phone]  Consultation  Referring Provider:     Dr. Francine Graven Primary Care Physician:  [facility] Reason for Consultation:     Anemia  Date of Admission:  08/10/2020 Date of Consultation:  08/10/2020         HPI:   Mark Fleming is a 65 y.o. male who is a patient at the [facility], presents with his wife due to slurred speech and right-sided weakness that started 24 hours ago.  Patient has history of previous CVA and is on Plavix chronically.  GI is being consulted for anemia and positive FOBT.  Wife states patient had 3-4 episodes of emesis 1 week ago.  The last episode of emesis that day had a minute amount of blood streak in it, and the rest of the episodes of emesis did not have any blood in it.  Patient and wife deny any  Denies melena, hematemesis, epistaxis, hematuria.  No prior history of GI bleed.  Patient is hemodynamically stable.  They deny any NSAID use.  Past Medical History:  Diagnosis Date  . Stroke [facility])     History reviewed. No pertinent surgical history.  Prior to Admission medications   Medication Sig Start Date End Date Taking? Authorizing Provider  abiraterone acetate (ZYTIGA) 250 MG tablet Take 1,000 mg by mouth daily. Take on an empty stomach 1 hour before or 2 hours after a meal   Yes [provider]  atorvastatin (LIPITOR) 80 MG tablet Take 80 mg by mouth at bedtime.   Yes [provider]  cholecalciferol (VITAMIN D3) 25 MCG (1000 UNIT) tablet Take 2,000 Units by mouth daily.   Yes [provider]  clopidogrel (PLAVIX) 75 MG tablet Take 75 mg by mouth at bedtime.   Yes [provider]  lisinopril (ZESTRIL) 40 MG tablet Take 20 mg by mouth daily.   Yes [provider]  predniSONE (DELTASONE) 5 MG tablet Take 5 mg by mouth in the morning  and at bedtime.   Yes [provider]  vitamin B-12 (CYANOCOBALAMIN) 1000 MCG tablet Take 1,000 mcg by mouth daily.   Yes [provider]    History reviewed. No pertinent family history.   Social History   Tobacco Use  . Smoking status: Current Every Day Smoker    Packs/day: 0.50    Types: Cigarettes  . Smokeless tobacco: Never Used  Substance Use Topics  . Alcohol use: Not Currently  . Drug use: Never    Allergies as of 08/10/2020  . (No Known Allergies)    Review of Systems:    All systems reviewed and negative except where noted in HPI.   Physical Exam:  Vital signs in last 24 hours: Vitals:   08/10/20 1254 08/10/20 1330 08/10/20 1415 08/10/20 1428  BP: (!) 127/45 (!) 126/55  112/68  Pulse: 67   68  Resp: 14 16 13 15   Temp: 98.3 F (36.8 C) 98.3 F (36.8 C) 98.2 F (36.8 C) 98.4 F (36.9 C)  TempSrc: Oral  Oral Oral  SpO2: 100%   100%  Weight:      Height:         General:   Pleasant, cooperative in NAD Head:  Normocephalic and atraumatic. Eyes:   No icterus.   Conjunctiva pink. PERRLA. Ears:  Normal auditory acuity. Neck:  Supple;  no masses or thyroidomegaly Lungs: Respirations even and unlabored. Lungs clear to auscultation bilaterally.   No wheezes, crackles, or rhonchi.  Abdomen:  Soft, nondistended, nontender. Normal bowel sounds. No appreciable masses or hepatomegaly.  No rebound or guarding.  Neurologic:  Alert and oriented x3;  grossly normal neurologically. Skin:  Intact without significant lesions or rashes. Cervical Nodes:  No significant cervical adenopathy. Psych:  Alert and cooperative. Normal affect.  LAB RESULTS: Recent Labs    08/10/20 0914  WBC 7.9  HGB 7.0*  HCT 26.5*  PLT 372   BMET Recent Labs    08/10/20 0914  NA 130*  K 3.3*  CL 95*  CO2 26  GLUCOSE 112*  BUN 5*  CREATININE 0.76  CALCIUM 9.0   LFT Recent Labs    08/10/20 0914  PROT 6.9  ALBUMIN 3.6  AST 46*  ALT 36  ALKPHOS 73  BILITOT  0.7   PT/INR Recent Labs    08/10/20 0914  LABPROT 13.3  INR 1.1    STUDIES: CT Head Wo Contrast  Result Date: 08/10/2020 CLINICAL DATA:  Neuro deficit, acute, stroke suspected. Additional history provided: Right-sided weakness, facial droop, new onset confusion. EXAM: CT HEAD WITHOUT CONTRAST TECHNIQUE: Contiguous axial images were obtained from the base of the skull through the vertex without intravenous contrast. COMPARISON:  Brain MRI 10/06/2011.  Head CT 10/05/2011. FINDINGS: Brain: Mild cerebral and cerebellar atrophy. Large region of encephalomalacia and gliosis within the left frontal lobe and insula (ACA and MCA vascular territories), likely reflecting a chronic infarct. Infarction changes have significantly increased in extent as compared to the brain MRI of 10/06/2011. Background mild ill-defined hypoattenuation within the cerebral white matter is nonspecific, but compatible with chronic small vessel ischemic disease. Redemonstrated chronic infarcts within the right cerebellar hemisphere. There is no acute intracranial hemorrhage. No acute demarcated cortical infarct is identified. No extra-axial fluid collection. No evidence of intracranial mass. No midline shift. Mega cisterna magna. Vascular: Atherosclerotic calcifications. Skull: Normal. Negative for fracture or focal lesion. Sinuses/Orbits: Visualized orbits show no acute finding. No significant paranasal sinus disease at the imaged levels. IMPRESSION: No evidence of acute intracranial abnormality. Large chronic infarct within the left frontal lobe and insula (ACA and MCA vascular territories), increased in extent as compared to the brain MRI of 10/06/2011. Progressive mild generalized parenchymal atrophy and mild cerebral white matter chronic small vessel ischemic disease. Redemonstrated chronic infarcts within the right cerebellar hemisphere. Electronically Signed   By: Kellie Simmering DO   On: 08/10/2020 09:50      Impression / Plan:    Mark Fleming is a 65 y.o. y/o male with presentation for right-sided weakness and slurred speech over the last 24 hours with previous history of C CVA on chronic Plavix, GI being consulted for anemia with heme positive stool  Patient has microcytic anemia and had emesis a week ago which was mainly nonbloody with 1 minute streak of blood on the latter episodes of emesis that day.  Patient may have had a Mallory-Weiss tear or esophagitis from his emesis a week ago.  Given ongoing work-up for CVA with symptoms starting about 24 hours ago, patient will be at high risk for endoscopic procedures at this time  However, if patient has active GI bleed, benefits and risk of procedure might change in that setting and patient will need to be evaluated at that time  Patient and wife report that they had a colonoscopy at the [place] about 2 to 3 years  ago and has had several colonoscopies due to history of polyps.  I do not have access to these procedure reports, however, given recent colonoscopies, risk of malignancy is low  PPI IV twice daily  Continue serial CBCs and transfuse PRN Avoid NSAIDs Maintain 2 large-bore IV lines Please page GI with any acute hemodynamic changes, or signs of active GI bleeding  Once neurology work-up is complete, can consider risks and benefits of possible upper endoscopy as an inpatient for acute anemia  Would highly recommend anemia work-up with ferritin, iron level, B12 and folate for further evaluation.  Replace with IV iron if necessary after results  Timing of procedure would also depend on last Plavix use.  However, if active bleeding occurs, we may need to proceed with upper endoscopy in the setting of Plavix use.  No active bleeding present at this time  Thank you for involving me in the care of this patient.      LOS: 0 days   Virgel Manifold, MD  08/10/2020, 3:34 PM

## 2020-08-10 NOTE — Consult Note (Signed)
Neurology Consult H&P  Mark Fleming MR# [id number] 08/10/2020  CC: acute ischemic stroke  History is obtained from: patient, his wife, staff and chart  HPI: Mark Fleming is a 65 y.o. 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 His wife stated he had bloody emesis 2 weeks ago.  The following was taken from Dr. Dorthula Perfect note 07/31/2020:  The patient "...presents to the emergency room via EMS for evaluation of slurred speech which started 24 hours prior to his presentation to the ER.  His wife who provides most of the history stated that she noticed her speech was slurred 1 day prior to his admission the patient refused to come to the emergency room for evaluation.  She also notes that he seems to be dragging his right leg and even though he has right-sided weakness from his prior stroke this appears to be a new problem per his wife.  He is able to respond to questions but has dysarthria which his wife states is new."  LKW: unclear tpa given: No osw IR Thrombectomy No,  Modified Rankin Scale: 2-Slight disability-UNABLE to perform all activities but does not need assistance NIHSS: 7  denies any fevers, cough, chest pain, or shortness of breath visual/hearing changes.  ROS: A complete ROS was performed and is negative except as noted in the HPI.   Past Medical History:  Diagnosis Date  . Stroke [facility])      History reviewed. No pertinent family history.  Social History:  reports that he has been smoking cigarettes. He has been smoking about 0.50 packs per day. He has never used smokeless tobacco. He reports previous alcohol use. He reports that he does not use drugs.   Prior to Admission medications   Medication Sig Start Date End Date Taking? Authorizing Provider  abiraterone acetate (ZYTIGA) 250 MG tablet Take 1,000 mg by mouth daily. Take on an empty stomach 1 hour before or 2  hours after a meal   Yes [provider]  atorvastatin (LIPITOR) 80 MG tablet Take 80 mg by mouth at bedtime.   Yes [provider]  cholecalciferol (VITAMIN D3) 25 MCG (1000 UNIT) tablet Take 2,000 Units by mouth daily.   Yes [provider]  clopidogrel (PLAVIX) 75 MG tablet Take 75 mg by mouth at bedtime.   Yes [provider]  lisinopril (ZESTRIL) 40 MG tablet Take 20 mg by mouth daily.   Yes [provider]  predniSONE (DELTASONE) 5 MG tablet Take 5 mg by mouth in the morning and at bedtime.   Yes [provider]  vitamin B-12 (CYANOCOBALAMIN) 1000 MCG tablet Take 1,000 mcg by mouth daily.   Yes [provider]    Exam: Current vital signs: BP 112/68   Pulse 68   Temp 98.4 F (36.9 C) (Oral)   Resp 15   Ht 5\' 9"  (1.753 m)   Wt 72.6 kg   SpO2 100%   BMI 23.63 kg/m   Physical Exam  Constitutional: Appears well-developed and well-nourished.  Psych: Affect appropriate to situation Eyes: No scleral injection HENT: No OP obstruction. Head: Normocephalic.  Cardiovascular: Normal rate and regular rhythm.  Respiratory: Effort normal, symmetric excursions bilaterally, no audible wheezing. GI: Soft.  No distension. There is no tenderness.  Skin: WDI  Neuro: Mental Status: Patient is awake, alert, oriented to person, place and situation. Patient is not able to give a clear and coherent  history. Speech severely impaired fluency, intact comprehension and impaired repetition. No signs neglect. Visual Fields are full. Pupils are equal, round, and reactive to light. EOMI without ptosis or diploplia.  Facial sensation is symmetric to temperature Facial right lower weakness.  Hearing is intact to voice. Uvula midline and palate elevates symmetrically. Shoulder shrug is symmetric. Tongue is midline without atrophy or fasciculations.  Tone is increased in right extremities. Bulk is normal. RUE 4/5 proximally and 2-3/5  distally/hand. RLE contracted and spastic with strength 2/5 however limited by spasticity and contracture. Sensation is grossly symmetric to temperature in the arms and legs. Deep Tendon Reflexes: brisk on right Babinski (+)R FNF and HKS unable to perform on right Gait - Deferred  I have reviewed labs in epic and the pertinent results are:   Ref. Range 08/10/2020 09:14  Sodium Latest Ref Range: 135 - 145 mmol/L 130 (L)  Potassium Latest Ref Range: 3.5 - 5.1 mmol/L 3.3 (L)  Chloride Latest Ref Range: 98 - 111 mmol/L 95 (L)   I have reviewed the images obtained: NCT head showed no evidence of acute intracranial abnormality. Large chronic infarct within the left frontal lobe and insula (ACA and MCA vascular territories), increased in extent as compared to the brain MRI of 10/06/2011.  Assessment: Mark Fleming is a 65 y.o. male PMHx large left hemispheric stroke now with new onset worsening of symptoms likely representing extension of old stroke. He is on chronic clopidogrel and found to have dark stools with low hemoglobin. He denied BRBPR and it is likely to be upper GI and related to his bloody emesis. He has significant vascular risk factors with history of multiple strokes with new extension of his chronic, multifocal left hemispheric strokes now with significantly disabling aphasia minimal function of his right (dominant) hand. He has lost a lot of function and is at high risk for severe disability if there are further left hemispheric strokes. Therefore, benefit from continuing clopidogrel outweighs the risk of bleeding.    Plan: - MRI brain without contrast. - Vascular imaging with MRA/CTA head and neck. - TTE. - Recommend labs: HbA1c, lipid panel, TSH. - Continue statin and adjust if LDL > 70 - Continue clopidogrel 75mg  - SBP<160.  - Start PPI. - Telemetry monitoring for arrhythmia. - Recommend bedside Swallow screen. - Recommend Stroke education. - Recommend PT/OT/SLP  consult. - Neurology will continue to follow.  This patient is critically ill and at significant risk of neurological worsening, death and care requires constant monitoring of vital signs, hemodynamics,respiratory and cardiac monitoring, neurological assessment, discussion with family, other specialists and medical decision making of high complexity. I spent 73 minutes of neurocritical care time  in the care of  this patient. This was time spent independent of any time provided by nurse practitioner or PA.  Electronically signed by:  Lynnae Sandhoff, MD Page: [phone] 08/10/2020, 3:07 PM

## 2020-08-10 NOTE — ED Notes (Signed)
Pt to CT

## 2020-08-11 ENCOUNTER — Inpatient Hospital Stay
Admit: 2020-08-11 | Discharge: 2020-08-11 | Disposition: A | Discharge status: Home / Self Care | Payer: No Typology Code available for payment source | Attending: Internal Medicine | Admitting: Internal Medicine

## 2020-08-11 ENCOUNTER — Other Ambulatory Visit: Payer: Self-pay

## 2020-08-11 DIAGNOSIS — I639 Cerebral infarction, unspecified: Secondary | ICD-10-CM | POA: Diagnosis not present

## 2020-08-11 DIAGNOSIS — D509 Iron deficiency anemia, unspecified: Secondary | ICD-10-CM | POA: Diagnosis not present

## 2020-08-11 DIAGNOSIS — I63232 Cerebral infarction due to unspecified occlusion or stenosis of left carotid arteries: Secondary | ICD-10-CM

## 2020-08-11 LAB — CBC
HCT: 26.6 % — ABNORMAL LOW (ref 39.0–52.0)
Hemoglobin: 7.6 g/dL — ABNORMAL LOW (ref 13.0–17.0)
MCH: 19.6 pg — ABNORMAL LOW (ref 26.0–34.0)
MCHC: 28.6 g/dL — ABNORMAL LOW (ref 30.0–36.0)
MCV: 68.7 fL — ABNORMAL LOW (ref 80.0–100.0)
Platelets: 325 10*3/uL (ref 150–400)
RBC: 3.87 MIL/uL — ABNORMAL LOW (ref 4.22–5.81)
RDW: 22.5 % — ABNORMAL HIGH (ref 11.5–15.5)
WBC: 7.9 10*3/uL (ref 4.0–10.5)
nRBC: 0 % (ref 0.0–0.2)

## 2020-08-11 LAB — BASIC METABOLIC PANEL
Anion gap: 7 (ref 5–15)
BUN: 6 mg/dL — ABNORMAL LOW (ref 8–23)
CO2: 25 mmol/L (ref 22–32)
Calcium: 8.9 mg/dL (ref 8.9–10.3)
Chloride: 99 mmol/L (ref 98–111)
Creatinine, Ser: 0.58 mg/dL — ABNORMAL LOW (ref 0.61–1.24)
GFR, Estimated: >60 mL/min (ref >60)
Glucose, Bld: 84 mg/dL (ref 70–99)
Potassium: 3.6 mmol/L (ref 3.5–5.1)
Sodium: 131 mmol/L — ABNORMAL LOW (ref 135–145)

## 2020-08-11 LAB — LIPID PANEL
Cholesterol: 84 mg/dL (ref 0–200)
HDL: 24 mg/dL — ABNORMAL LOW (ref >40)
LDL Cholesterol: 46 mg/dL (ref 0–99)
Total CHOL/HDL Ratio: 3.5 RATIO
Triglycerides: 69 mg/dL (ref <150)
VLDL: 14 mg/dL (ref 0–40)

## 2020-08-11 LAB — MAGNESIUM: Magnesium: 1.8 mg/dL (ref 1.7–2.4)

## 2020-08-11 LAB — IRON AND TIBC
Iron: 70 ug/dL (ref 45–182)
Saturation Ratios: 19 % (ref 17.9–39.5)
TIBC: 363 ug/dL (ref 250–450)
UIBC: 293 ug/dL

## 2020-08-11 LAB — ECHOCARDIOGRAM COMPLETE
AR max vel: 3.57 cm2
AV Area VTI: 3.7 cm2
AV Area mean vel: 3.63 cm2
AV Mean grad: 3 mmHg
AV Peak grad: 5.1 mmHg
Ao pk vel: 1.13 m/s
Area-P 1/2: 3.3 cm2
Height: 70 in
S' Lateral: 2.39 cm
Weight: 2797.2 oz

## 2020-08-11 LAB — HEMOGLOBIN A1C
Hgb A1c MFr Bld: 5.1 % (ref 4.8–5.6)
Mean Plasma Glucose: 99.67 mg/dL

## 2020-08-11 LAB — RETICULOCYTES
Immature Retic Fract: 44.3 % — ABNORMAL HIGH (ref 2.3–15.9)
RBC.: 3.87 MIL/uL — ABNORMAL LOW (ref 4.22–5.81)
Retic Count, Absolute: 48.8 10*3/uL (ref 19.0–186.0)
Retic Ct Pct: 1.3 % (ref 0.4–3.1)

## 2020-08-11 LAB — PATHOLOGIST SMEAR REVIEW

## 2020-08-11 LAB — VITAMIN B12: Vitamin B-12: 1373 pg/mL — ABNORMAL HIGH (ref 180–914)

## 2020-08-11 LAB — FERRITIN: Ferritin: 10 ng/mL — ABNORMAL LOW (ref 24–336)

## 2020-08-11 LAB — TSH: TSH: 0.708 u[IU]/mL (ref 0.350–4.500)

## 2020-08-11 LAB — FOLATE: Folate: 11.2 ng/mL (ref >5.9)

## 2020-08-11 LAB — PREPARE RBC (CROSSMATCH)

## 2020-08-11 MED ORDER — POTASSIUM CHLORIDE CRYS ER 20 MEQ PO TBCR
40.0000 meq | EXTENDED_RELEASE_TABLET | Freq: Once | ORAL | Status: AC
  Administered 2020-08-11: 40 meq via ORAL
  Filled 2020-08-11: qty 2

## 2020-08-11 MED ORDER — ASPIRIN 81 MG PO CHEW
81.0000 mg | CHEWABLE_TABLET | Freq: Every day | ORAL | Status: DC
  Administered 2020-08-11 – 2020-08-12 (×2): 81 mg via ORAL
  Filled 2020-08-11 (×2): qty 1

## 2020-08-11 MED ORDER — SODIUM CHLORIDE 0.9% IV SOLUTION: Freq: Once | INTRAVENOUS | Status: AC

## 2020-08-11 MED ORDER — MAGNESIUM OXIDE 400 (241.3 MG) MG PO TABS
400.0000 mg | ORAL_TABLET | Freq: Two times a day (BID) | ORAL | Status: AC
  Administered 2020-08-11 (×2): 400 mg via ORAL
  Filled 2020-08-11 (×2): qty 1

## 2020-08-11 MED ORDER — NICOTINE POLACRILEX 2 MG MT GUM
2.0000 mg | CHEWING_GUM | OROMUCOSAL | Status: DC | PRN
  Administered 2020-08-11 – 2020-08-12 (×2): 2 mg via ORAL
  Filled 2020-08-11 (×3): qty 1

## 2020-08-11 MED ORDER — SODIUM CHLORIDE 0.9 % IV SOLN
510.0000 mg | Freq: Once | INTRAVENOUS | Status: AC
  Administered 2020-08-11: 14:00:00 510 mg via INTRAVENOUS
  Filled 2020-08-11: qty 17

## 2020-08-11 NOTE — Progress Notes (Addendum)
Vonda Antigua, MD [address] [place], [place], [place], [address] [address], [place], [place], [place], [address] Phone: [phone]  Fax: [phone]   Subjective: Patient denies any episodes of active bleeding.  No abdominal pain, nausea or vomiting.   Objective: Exam: Vital signs in last 24 hours: Vitals:   08/11/20 0000 08/11/20 0046 08/11/20 0439 08/11/20 0720  BP:  (!) 120/57 (!) 97/51 111/61  Pulse: 68 66 61 (!) 57  Resp: 14 14 16 17   Temp:  99.3 F (37.4 C) 98.1 F (36.7 C) 98.4 F (36.9 C)  TempSrc:  Oral Oral Oral  SpO2: 97% 98% 99% 96%  Weight:  79.3 kg    Height:  5\' 10"  (1.778 m)     Weight change:   Intake/Output Summary (Last 24 hours) at 08/11/2020 1355 Last data filed at 08/11/2020 1002 Gross per 24 hour  Intake 684.67 ml  Output --  Net 684.67 ml    General: No acute distress, AAO x3 Abd: Soft, NT/ND, No HSM Skin: Warm, no rashes Neck: Supple, Trachea midline   Lab Results: Lab Results  Component Value Date   WBC 7.9 08/11/2020   HGB 7.6 (L) 08/11/2020   HCT 26.6 (L) 08/11/2020   MCV 68.7 (L) 08/11/2020   PLT 325 08/11/2020   Micro Results: Recent Results (from the past 240 hour(s))  SARS CORONAVIRUS 2 (TAT 6-24 HRS) Nasopharyngeal Nasopharyngeal Swab     Status: None   Collection Time: 08/10/20 10:35 AM   Specimen: Nasopharyngeal Swab  Result Value Ref Range Status   SARS Coronavirus 2 NEGATIVE NEGATIVE Final    Comment: (NOTE) SARS-CoV-2 target nucleic acids are NOT DETECTED.  The SARS-CoV-2 RNA is generally detectable in upper and lower respiratory specimens during the acute phase of infection. Negative results do not preclude SARS-CoV-2 infection, do not rule out co-infections with other pathogens, and should not be used as the sole basis for treatment or other patient management decisions. Negative results must be combined with clinical observations, patient history, and epidemiological information. The  expected result is Negative.  Fact Sheet for Patients: SugarRoll.be  Fact Sheet for Healthcare Providers: https://www.woods-mathews.com/  This test is not yet approved or cleared by the [place] FDA and  has been authorized for detection and/or diagnosis of SARS-CoV-2 by FDA under an Emergency Use Authorization (EUA). This EUA will remain  in effect (meaning this test can be used) for the duration of the COVID-19 declaration under Se ction 564(b)(1) of the Act, 21 U.S.C. section 360bbb-3(b)(1), unless the authorization is terminated or revoked sooner.  Performed at [facility], [place] [address], [place], [place] [address]    Studies/Results: CT Head Wo Contrast  Result Date: 08/10/2020 CLINICAL DATA:  Neuro deficit, acute, stroke suspected. Additional history provided: Right-sided weakness, facial droop, new onset confusion. EXAM: CT HEAD WITHOUT CONTRAST TECHNIQUE: Contiguous axial images were obtained from the base of the skull through the vertex without intravenous contrast. COMPARISON:  Brain MRI 10/06/2011.  Head CT 10/05/2011. FINDINGS: Brain: Mild cerebral and cerebellar atrophy. Large region of encephalomalacia and gliosis within the left frontal lobe and insula (ACA and MCA vascular territories), likely reflecting a chronic infarct. Infarction changes have significantly increased in extent as compared to the brain MRI of 10/06/2011. Background mild ill-defined hypoattenuation within the cerebral white matter is nonspecific, but compatible with chronic small vessel ischemic disease. Redemonstrated chronic infarcts within the right cerebellar hemisphere. There is no acute intracranial hemorrhage. No acute demarcated cortical  infarct is identified. No extra-axial fluid collection. No evidence of intracranial mass. No midline shift. Mega cisterna magna. Vascular: Atherosclerotic calcifications. Skull: Normal. Negative for fracture or  focal lesion. Sinuses/Orbits: Visualized orbits show no acute finding. No significant paranasal sinus disease at the imaged levels. IMPRESSION: No evidence of acute intracranial abnormality. Large chronic infarct within the left frontal lobe and insula (ACA and MCA vascular territories), increased in extent as compared to the brain MRI of 10/06/2011. Progressive mild generalized parenchymal atrophy and mild cerebral white matter chronic small vessel ischemic disease. Redemonstrated chronic infarcts within the right cerebellar hemisphere. Electronically Signed   By: Kellie Simmering DO   On: 08/10/2020 09:50   MR BRAIN WO CONTRAST  Result Date: 08/10/2020 CLINICAL DATA:  Abnormal speech, history of stroke EXAM: MRI HEAD WITHOUT CONTRAST TECHNIQUE: Multiplanar, multiecho pulse sequences of the brain and surrounding structures were obtained without intravenous contrast. COMPARISON:  2014 FINDINGS: Brain: There is cortical restricted diffusion in the left frontoparietal lobes including involvement of precentral and postcentral gyri. Large chronic infarction anterior to this region involving the frontal lobe ACA and MCA territories with chronic blood products and associated volume loss. Additional chronic right cerebellar infarcts. Other patchy T2 hyperintensity in the supratentorial white matter likely reflects chronic microvascular ischemic changes. There is no intracranial mass or mass effect. There is no hydrocephalus or extra-axial fluid collection. Prominence of the ventricles and sulci reflects parenchymal volume loss. Vascular: Diminished left ICA flow void in keeping with prior CTA findings. Major vessel flow voids at the skull base are otherwise preserved. Skull and upper cervical spine: Normal marrow signal is preserved. Sinuses/Orbits: Paranasal sinuses are aerated. Orbits are unremarkable. Other: Sella is unremarkable.  Mastoid air cells are clear. IMPRESSION: Small areas of acute cortical infarction in the  left frontoparietal lobes involving MCA territory. Large chronic left ACA and MCA territory infarcts. Multiple chronic right cerebellar infarcts. Electronically Signed   By: Macy Mis M.D.   On: 08/10/2020 16:59   US Carotid Bilateral  Result Date: 08/10/2020 CLINICAL DATA:  Acute left MCA territory stroke, hypertension, hyperlipidemia, tobacco abuse EXAM: BILATERAL CAROTID DUPLEX ULTRASOUND TECHNIQUE: Pearline Cables scale imaging, color Doppler and duplex ultrasound were performed of bilateral carotid and vertebral arteries in the neck. COMPARISON:  10/07/2011 FINDINGS: Criteria: Quantification of carotid stenosis is based on velocity parameters that correlate the residual internal carotid diameter with NASCET-based stenosis levels, using the diameter of the distal internal carotid lumen as the denominator for stenosis measurement. The following velocity measurements were obtained: RIGHT ICA: 141/53 cm/sec CCA: 40/08 cm/sec SYSTOLIC ICA/CCA RATIO:  2.1 ECA: 121 cm/sec LEFT ICA: Occluded cm/sec CCA: 676/1 cm/sec SYSTOLIC ICA/CCA RATIO:  0 ECA: 120 cm/sec RIGHT CAROTID ARTERY: Antegrade, minimal atheromatous plaque. RIGHT VERTEBRAL ARTERY:  Antegrade LEFT CAROTID ARTERY:  Antegrade, minimal atheromatous plaque. LEFT VERTEBRAL ARTERY:  Antegrade IMPRESSION: 1. Chronic occlusion of the left internal carotid artery, identified on prior CT a neck 10/07/2011. 2. Estimated 50-69% stenosis within the right ICA based on peak systolic velocity. Electronically Signed   By: Randa Ngo M.D.   On: 08/10/2020 19:17   Medications:  Scheduled Meds: .  stroke: mapping our early stages of recovery book   Does not apply Once  . sodium chloride   Intravenous Once  . abiraterone acetate  1,000 mg Oral Daily  . aspirin  81 mg Oral Daily  . atorvastatin  80 mg Oral QHS  . cholecalciferol  2,000 Units Oral Daily  . magnesium oxide  400  mg Oral BID  . nicotine  21 mg Transdermal Daily  . pantoprazole (PROTONIX) IV  40 mg  Intravenous Q12H  . potassium chloride  40 mEq Oral Once  . predniSONE  5 mg Oral BID WC  . vitamin B-12  1,000 mcg Oral Daily   Continuous Infusions: . sodium chloride 10 mL/hr at 08/11/20 0020  . ferumoxytol     PRN Meds:.acetaminophen **OR** acetaminophen (TYLENOL) oral liquid 160 mg/5 mL **OR** acetaminophen, nicotine polacrilex, senna-docusate   Assessment: Principal Problem:   CVA (cerebral vascular accident) ([place]) Active Problems:   Acute blood loss anemia   Nicotine dependence   Prostate cancer ([place])    Plan: I have discussed the case with anesthesia, Dr. Lubertha Basque, and neurology Patient's MRI shows extension of left MCA stroke  Given the acute changes seen on MRI, endoscopy would be a high risk procedure for him as per Dr. Lubertha Basque.  Hypotension during the procedure can worsen the watershed area of infarct and according to her typically after an acute stroke 30 days weight is recommended prior to endoscopic procedures  However, clinical status and active bleeding would definitely change any urgency for the procedure  Patient has not had any signs of active bleeding since presentation.  No melena, hematochezia, hematemesis, or any other signs of active bleeding  In fact, patient and wife state he did not have any active bleeding at home either for at least a week.  A week before presentation he had 3-4 episodes of initially nonbloody emesis and last episode of emesis had a minute amount of blood streaks in it  With the above in mind, and patient's report of having a colonoscopy within the last 3 years at the [place], and with no evidence of hemodynamically significant bleed, endoscopic procedures would be higher risks than benefits  Risks including worsening of stroke during the endoscopy as sedation can lead to hypotension during the procedure and worsening of the stroke as per anesthesia, as written above  However, if active bleeding occurs, anemia worsens, benefits of  procedure may outweigh risks and procedure may need to be done at that time  If DOAC/antiplatelet/DAPT/anticoagulation needs to be started per neurology, would recommend close monitoring for any signs of active bleeding.  If active bleeding occurs, can proceed with endoscopy in the setting of DAPT/anticoagulation as well  In addition, the PPI on board will help heal any underlying gastric ulcer/irritation or esophagitis that could have led to his minute hematemesis a week ago as well  GI team will continue to follow please page if any changes in signs or symptoms or any concerns  Dr. Haig Prophet will be following the patient over the weekend please page with any questions or concerns  PPI IV twice daily  Continue serial CBCs and transfuse PRN Avoid NSAIDs Maintain 2 large-bore IV lines Please page GI with any acute hemodynamic changes, or signs of active GI bleeding    LOS: 1 day   Vonda Antigua, MD 08/11/2020, 1:55 PM

## 2020-08-11 NOTE — Evaluation (Signed)
Physical Therapy Evaluation Patient Details Name: Mark Fleming MRN: [id number] DOB: 07/30/1955 Today's Date: 08/11/2020   History of Present Illness  Pt is a 65 y/o M admitted on 08/10/20 with c/c of slurred speech & RLE weakness. MRI reveals small acute corrical infarct in L frontoparietal lobes. GI is being consulted for anemia and positive FOBT. PMH: CVA with R sided hemiparesis, nicotine dependence, HTN, dyslipidemia, prostate CA  Clinical Impression  Pt seen for PT evaluation with pt's wife present. Pt requires heavy mod/max assist for transfers, max assist to attempt to take steps. Pt with decreased weight shift to R & R knee buckling with PT attempting to provide blocking to prevent when taking steps. Pt is motivated to participate & per wife was mod I with cane prior to admission. Pt would benefit from extensive therapy services to maximize independence with functional mobility & reduce fall risk & would be good CIR candidate. Will continue to follow pt acutely to progress gait & transfers with LRAD, but anticipate need of +2 assist for safe ambulation at this time.     Follow Up Recommendations CIR;Supervision for mobility/OOB    Equipment Recommendations  None recommended by PT    Recommendations for Other Services       Precautions / Restrictions Precautions Precautions: Fall Precaution Comments: R hemi Restrictions Weight Bearing Restrictions: No      Mobility  Bed Mobility Overal bed mobility: Needs Assistance Bed Mobility: Supine to Sit     Supine to sit: Supervision;HOB elevated     General bed mobility comments: use of bed rails    Transfers Overall transfer level: Needs assistance   Transfers: Sit to/from Stand;Stand Pivot Transfers Sit to Stand: Mod assist Stand pivot transfers: Max assist       General transfer comment: Difficulty shifting weight to RLE, RUE HHA  Ambulation/Gait Ambulation/Gait assistance: Max assist Gait Distance (Feet): 2  Feet Assistive device: 1 person hand held assist Gait Pattern/deviations: Decreased dorsiflexion - right;Decreased stride length;Decreased stance time - right;Decreased step length - right;Decreased step length - left;Decreased weight shift to right Gait velocity: decreased   General Gait Details: Pt provides A/P blocking at R knee to prevent buckling when pt weight shifts to R to advance LLE (buckling noted)  Stairs            Wheelchair Mobility    Modified Rankin (Stroke Patients Only)       Balance Overall balance assessment: Needs assistance Sitting-balance support: Feet supported Sitting balance-Leahy Scale: Good     Standing balance support: During functional activity;Single extremity supported Standing balance-Leahy Scale: Poor                               Pertinent Vitals/Pain Pain Assessment: Faces Faces Pain Scale: Hurts even more Pain Location: R ankle Pain Descriptors / Indicators: Sore Pain Intervention(s): Repositioned;Limited activity within patient's tolerance    Home Living Family/patient expects to be discharged to:: Private residence Living Arrangements: Spouse/significant other;Other relatives (mother in law) Available Help at Discharge: Family;Available 24 hours/day Type of Home: House Home Access: Ramped entrance     Home Layout: One level Home Equipment: Keith - quad;Shower seat;Wheelchair - manual      Prior Function Level of Independence: Independent with assistive device(s)         Comments: Mod I with cane up until 1 month ago     Hand Dominance   Dominant Hand: Right (originally RUE,  now LUE dominant 2/2 hx of CVA)    Extremity/Trunk Assessment   Upper Extremity Assessment Upper Extremity Assessment: RUE deficits/detail RUE Deficits / Details: limited ROM/increased tone at baseline from prior CVA. Pt reports he attempts B UE coordination tasks but uses L UE as dominant hand. Pt reports no additional weakness  at this time.    Lower Extremity Assessment Lower Extremity Assessment:  (2+/5 knee extension, no active dorsiflexion observed in long sitting in recliner)       Communication   Communication: Expressive difficulties;Other (comment) (from prior CVA)  Cognition Arousal/Alertness: Awake/alert Behavior During Therapy: WFL for tasks assessed/performed Overall Cognitive Status: Within Functional Limits for tasks assessed                                 General Comments: Pt is very pleasant. Expressive difficulties at baseline. Pt able to answer questions with increased time, selection of 2, and describing to therapist. Pt following command and sequencing with increased time.      General Comments General comments (skin integrity, edema, etc.): Edu: on need to attempt ankle pumps for neuro re-ed RLE    Exercises     Assessment/Plan    PT Assessment Patient needs continued PT services  PT Problem List Decreased strength;Decreased mobility;Decreased coordination;Decreased activity tolerance;Decreased balance;Decreased knowledge of use of DME       PT Treatment Interventions DME instruction;Therapeutic activities;Modalities;Therapeutic exercise;Gait training;Patient/family education;Stair training;Balance training;Wheelchair mobility training;Functional mobility training;Neuromuscular re-education;Manual techniques    PT Goals (Current goals can be found in the Care Plan section)  Acute Rehab PT Goals Patient Stated Goal: to get (R) leg better PT Goal Formulation: With patient/family Time For Goal Achievement: 08/25/20 Potential to Achieve Goals: Good    Frequency 7X/week   Barriers to discharge Decreased caregiver support wife cannot provide physical assist    Co-evaluation               AM-PAC PT "6 Clicks" Mobility  Outcome Measure Help needed turning from your back to your side while in a flat bed without using bedrails?: None Help needed moving from  lying on your back to sitting on the side of a flat bed without using bedrails?: A Little Help needed moving to and from a bed to a chair (including a wheelchair)?: A Lot Help needed standing up from a chair using your arms (e.g., wheelchair or bedside chair)?: A Lot Help needed to walk in hospital room?: A Lot Help needed climbing 3-5 steps with a railing? : Total 6 Click Score: 14    End of Session Equipment Utilized During Treatment: Gait belt Activity Tolerance: Patient tolerated treatment well Patient left: in chair;with chair alarm set;with call bell/phone within reach;with family/visitor present Nurse Communication: Mobility status PT Visit Diagnosis: Unsteadiness on feet (R26.81);Muscle weakness (generalized) (M62.81);Difficulty in walking, not elsewhere classified (R26.2);Hemiplegia and hemiparesis Hemiplegia - Right/Left: Right Hemiplegia - dominant/non-dominant: Dominant Hemiplegia - caused by: Cerebral infarction    Time: 1425-1443 PT Time Calculation (min) (ACUTE ONLY): 18 min   Charges:   PT Evaluation $PT Eval Low Complexity: 1 Low PT Treatments $Neuromuscular Re-education: 8-22 mins        Lavone Nian, PT, DPT 08/11/20, 4:17 PM   Waunita Schooner 08/11/2020, 4:16 PM

## 2020-08-11 NOTE — Progress Notes (Addendum)
Neurology Progress Note  Patient ID: Mark Fleming is a 65 y.o. with PMHx of  has a past medical history of Stroke ([place]). presenting with extension of L MCA stroke, anemia w/ c/f GI bleed  Initially consulted for: Worsening aphasia and right hand weakness  Subjective: -Feels he has improved since admission but not back to his prior baseline -Communication does appear much improved than previously documented  Exam: Vitals:   08/11/20 0439 08/11/20 0720  BP: (!) 97/51 111/61  Pulse: 61 (!) 57  Resp: 16 17  Temp: 98.1 F (36.7 C) 98.4 F (36.9 C)  SpO2: 99% 96%   Gen: In bed, comfortable  Resp: non-labored breathing, no grossly audible wheezing Cardiac: Perfusing extremities well  Abd: soft, nt Psychiatric: Calm, cooperative, appropriate and occasionally frustrated secondary to aphasia  Neuro:  Mental Status: Patient is awake, alert, oriented to person, place and situation. Patient is not able to give some history, but occasionally limited by his aphasia. Speech  with moderately impaired fluency, intact comprehension and  intact repetition (today is a sunny day and no incentives and bouts about it were both repeated clearly). No signs  of neglect. Visual Fields are notable for a right lower field cut. Pupils are equal, round, and reactive to light. EOMI without ptosis or diploplia, the pursuits are somewhat saccadic.  Facial sensation is symmetric to temperature Facial right lower weakness.  Hearing is intact to voice. Uvula midline and palate elevates symmetrically. Shoulder shrug is symmetric. Tongue is midline without atrophy or fasciculations.  Tone is increased in right extremities. Bulk is normal. RUE 4/5 proximally and 2-3/5 distally/hand. RLE contracted and spastic, however at least 4/5 in hip flexion, with knee flexion weaker than knee extension though there is a chronic knee flexion contracture. Sensation is grossly symmetric to temperature in the arms and legs.   There is no extinction to double simultaneous stimuli Deep Tendon Reflexes: brisk on right FNF and HKS unable to perform on right Gait - Deferred   Pertinent Labs: Lab Results  Component Value Date   HGBA1C 5.1 08/11/2020  - current level pending  Lab Results  Component Value Date   CHOL 84 08/11/2020   HDL 24 (L) 08/11/2020   LDLCALC 46 08/11/2020   TRIG 69 08/11/2020   CHOLHDL 3.5 08/11/2020   Lab Results  Component Value Date   IRON 70 08/11/2020   TIBC 363 08/11/2020   FERRITIN 10 (L) 08/11/2020   Lab Results  Component Value Date   VITAMINB12 1,373 (H) 08/11/2020     Lab Results  Component Value Date   TSH 0.708 08/11/2020    ECHO:  1. Left ventricular ejection fraction, by estimation, is 55 to 60%. The  left ventricle has normal function. The left ventricle has no regional  wall motion abnormalities. Left ventricular diastolic parameters were  normal.  2. Right ventricular systolic function is normal. The right ventricular  size is normal.  3. The mitral valve is normal in structure. Trivial mitral valve  regurgitation.  4. The aortic valve is normal in structure. Aortic valve regurgitation is  trivial.   FINDINGS  Left Ventricle: Left ventricular ejection fraction, by estimation, is 55  to 60%. The left ventricle has normal function. The left ventricle has no  regional wall motion abnormalities. The left ventricular internal cavity  size was normal in size. There is  no left ventricular hypertrophy. Left ventricular diastolic parameters  were normal.   Right Ventricle: The right ventricular size is  normal. No increase in  right ventricular wall thickness. Right ventricular systolic function is  normal.   Left Atrium: Left atrial size was normal in size.   Right Atrium: Right atrial size was normal in size.   Pericardium: There is no evidence of pericardial effusion.   Mitral Valve: The mitral valve is normal in structure. Trivial mitral   valve regurgitation.   Tricuspid Valve: The tricuspid valve is normal in structure. Tricuspid  valve regurgitation is trivial.   Aortic Valve: The aortic valve is normal in structure. Aortic valve  regurgitation is trivial. Aortic valve mean gradient measures 3.0 mmHg.  Aortic valve peak gradient measures 5.1 mmHg. Aortic valve area, by VTI  measures 3.70 cm.   Pulmonic Valve: The pulmonic valve was normal in structure. Pulmonic valve  regurgitation is not visualized.   Aorta: The aortic root and ascending aorta are structurally normal, with  no evidence of dilitation.   IAS/Shunts: No atrial level shunt detected by color flow Doppler.   Impression: This is a 65 year old gentleman with a chronic left internal carotid artery occlusion now presenting with worsening stroke in the left MCA territory.  Given his anemia and relative hypotension, suspect that this is a failure of collaterals in the setting of inadequate perfusion.  Thankfully his aphasia does appear to be improving but given the high debility from left MCA stroke, appreciate primary team's attention to aggressive work-up and treatment of his anemia  Stroke workup completed thus far: - MRI brain w/ extension of prior L MCA stroke - CTA (2013) with chronic occlusion of L carotid artery, mild aortic arch athero and scattered arthero w/o flow limiting stenosis other than LMCA occlusion  - Carotid Ultrasound:  1. Chronic occlusion of the left internal carotid artery, identified on prior CT a neck 10/07/2011. 2. Estimated 50-69% stenosis within the right ICA based on peak systolic velocity. - Labs with hemoglobin A1c 5.1%, cholesterol 84, HDL 24, LDL 46, triglycerides 69; TSH 0.708 - ECHO: Normal, including atrial sizes   Recommendations: - Agree with GI recommendations for b12 and folate levels (orders placed, will help confirm adherence to home supplementation); please do consider IV iron if indicated - Aggressive treatment of  anemia as hypoperfusion secondary to anemia may have contributed significantly to his stroke -Recommend labs: TSH (ordered)  -Continue home atorvastatin 80 mg, LDL at goal of < 70) - Continue clopidogrel 75 mg daily, may be paused temporarily per GI as needed for procedures; okay to switch to aspirin 81 mg daily monotherapy from a neurological perspective  - SBP<160.  - Continue PPI. - Telemetry monitoring for arrhythmia. -RecommendStroke education, risk factor modification (patient counseled on smoking cessation, medication adherence, prompt presentation for evaluation of new neurological deficits in the future)  -Patient ordered for nicotine gum as a smoking cessation aid per his preference - Appreciate PT/OT/SLP consult. - Neurology will sign off at this time Otherwise we will be available on an as-needed basis going forward, please reconsult for any questions - Plan was communicated to Dr. Loleta Books and dicussed with GI team as well  Lesleigh Noe MD-PhD [facility] [phone]   40 minutes were spent in the care of this patient today as documented above including chart review, examination, plan, and communication with primary team and GI team

## 2020-08-11 NOTE — TOC Progression Note (Signed)
Transition of Care [facility]) - Progression Note    Patient Details  Name: HARUTYUN MONTEVERDE MRN: [id number] Date of Birth: Sep 29, 1955  Transition of Care [facility]) CM/SW Contact  Shelbie Hutching, RN Phone Number: 08/11/2020, 3:08 PM  Clinical Narrative:    RNCM verified with patient and wife at the bedside that the plan is for patient to go home with home health services. [facility] has accepted the referral for RN, PT, OT and speech.  RNCM will fax referral over to the [place] for authorization.   SNF was discussed but patient wants to go home and wife is comfortable helping him at home.    Expected Discharge Plan: [place] Barriers to Discharge: Continued Medical Work up  Expected Discharge Plan and Services Expected Discharge Plan: [place]   Discharge Planning Services: CM Consult Post Acute Care Choice: [place] arrangements for the past 2 months: Single Family Home                 DME Arranged: N/A DME Agency: NA       HH Arranged: RN,PT,OT,Nurse's Aide [place]: [place] ([facility]) Date [place]: 08/11/20 Time [place]: 0932 Representative spoke with at [place]: [place] ([place]) Interventions    Readmission Risk Interventions No flowsheet data found.

## 2020-08-11 NOTE — Progress Notes (Signed)
*  PRELIMINARY RESULTS* Echocardiogram 2D Echocardiogram has been performed.  Sherrie Sport 08/11/2020, 8:21 AM

## 2020-08-11 NOTE — Progress Notes (Signed)
Initial Nutrition Assessment  DOCUMENTATION CODES:   Not applicable  INTERVENTION:   -Boost Breeze po TID, each supplement provides 250 kcal and 9 grams of protein -MVI with minerals daily -30 ml Prosource Plus TID, each supplement provides 100 kcals and 15 grams protein -RD will follow for diet advancement and adjust supplement regimen as appropriate  NUTRITION DIAGNOSIS:   Inadequate oral intake related to altered GI function as evidenced by other (comment) (clear liquid diet due to heme positive stool).  GOAL:   Patient will meet greater than or equal to 90% of their needs  MONITOR:   PO intake,Supplement acceptance,Diet advancement,Labs,Weight trends,Skin,I & O's  REASON FOR ASSESSMENT:   Malnutrition Screening Tool    ASSESSMENT:   Patient is a 65 year old male who presents to the ER for evaluation of dysarthria and right lower extremity weakness.  He has a history of a prior stroke with right-sided hemiparesis but appears to have new neurologic symptoms.  Pt admitted with dysarthria and worsening rt lower extremity weakness to rule out CVA.   Reviewed I/O's: +565 ml x 24 hours  Pt unavailable at time of visit. Attempted to speak with pt and family via call to hospital room, however, unable to reach. Unable to obtain further nutrition-related history at this time.   Per GI notes, pt with heme positive stool. He has history of possible Mallory Weiss tear. Per GI not active bleeding at present. May consider upper endoscopy when neurology work-up is complete.   Pt is currently on clear liquid diet. No meal intake available to assess at this time. Pt would greatly benefit from addition of oral nutrition supplements due to limitations of clear liquid diet.   No wt available to assess wt changes at this time.   Medications reviewed and include vitamin D3, potassium chloride, prednisone, vitamin B-12, and 0.9% sodium chloride infusion @ 10 ml/hr.   Labs reviewed: Na:  131.   Diet Order:   Diet Order            Diet clear liquid Room service appropriate? Yes; Fluid consistency: Thin  Diet effective now                 EDUCATION NEEDS:   No education needs have been identified at this time  Skin:  Skin Assessment: Reviewed RN Assessment  Last BM:  08/09/20  Height:   Ht Readings from Last 1 Encounters:  08/11/20 5\' 10"  (1.778 m)    Weight:   Wt Readings from Last 1 Encounters:  08/11/20 79.3 kg    Ideal Body Weight:  75.5 kg  BMI:  Body mass index is 25.08 kg/m.  Estimated Nutritional Needs:   Kcal:  2000-2200  Protein:  105-120 grams  Fluid:  > 2 L    Loistine Chance, RD, LDN, [place] Registered Dietitian II Certified Diabetes Care and Education Specialist Please refer to [facility] for RD and/or RD on-call/weekend/after hours pager

## 2020-08-11 NOTE — Plan of Care (Signed)
End of Shift Summary:  Alert and oriented to self, time and place. Reoriented to situation. Mild dysarthria noted. VSS. Remained on room air, sats >95%. Denies pain or n/v. Pt home medication sent to pharmacy for label. Remained free from falls or injury. Bed low and in locked position. Call bell within reach and able to use.    Problem: Education: Goal: Knowledge of General Education information will improve Description: Including pain rating scale, medication(s)/side effects and non-pharmacologic comfort measures Outcome: Progressing   Problem: Health Behavior/Discharge Planning: Goal: Ability to manage health-related needs will improve Outcome: Progressing   Problem: Clinical Measurements: Goal: Ability to maintain clinical measurements within normal limits will improve Outcome: Progressing Goal: Will remain free from infection Outcome: Progressing Goal: Diagnostic test results will improve Outcome: Progressing Goal: Respiratory complications will improve Outcome: Progressing Goal: Cardiovascular complication will be avoided Outcome: Progressing

## 2020-08-11 NOTE — Evaluation (Signed)
Speech Language Pathology Evaluation Patient Details Name: Mark Fleming MRN: [id number] DOB: [birth year] Today's Date: 08/11/2020 Time: 9629-5284 SLP Time Calculation (min) (ACUTE ONLY): 60 min  Problem List:  Patient Active Problem List   Diagnosis Date Noted  . CVA (cerebral vascular accident) ([place]) 08/10/2020  . Acute blood loss anemia 08/10/2020  . Nicotine dependence 08/10/2020  . Prostate cancer ([place]) 08/10/2020   Past Medical History:  Past Medical History:  Diagnosis Date  . Stroke [facility])    Past Surgical History: History reviewed. No pertinent surgical history. HPI:  Pt is a 65 y.o. who  has a past medical history of Stroke ([place]). presenting with extension of L MCA stroke, anemia w/ c/f GI bleed.  PMH includes: nicotine dependence, hypertension, dyslipidemia and prostate cancer.  He presented to the emergency room via EMS for evaluation of slurred speech which started 24 hours prior to his presentation to the ER.  Wife reported pt dragging his right leg and even though he has right-sided weakness from his prior stroke, this appears to be a new problem per his wife.  MRI: Small areas of acute cortical infarction in the left frontoparietal  lobes involving MCA territory. Large chronic left ACA and MCA territory infarcts. Multiple chronic  right cerebellar infarcts.   Assessment / Plan / Recommendation Clinical Impression  Pt presented to the hospital w/ worsened Dysarthria and R sided weakness(leg). MRI completed revealing: "Small areas of acute cortical infarction in the left frontoparietal  lobes involving MCA territory. Large chronic left ACA and MCA territory infarcts. Multiple chronic  right cerebellar infarcts.".  He was administered the Western Aphasia Battery - Bedside, which revealed Expressive and Receptive language deficits, Aphasia. Dysarthria impacted Articulation of verbal responses. Of note, pt is also Missing Dentition which impacts articulation of speech sounds.  Unsure of the degree of Baseline deficits s/p previous Stroke; pt was unable to fully articulate his prior Language functioning. When asked if pt felt his Speech deficits during this assessment were "worse than before", pt stated "maybe a little". He often indicated that his Word-finding deficits were "about the same" when inquired about during different tasks. Pt endorsed his Speech was "not completely clear before".  During eval tasks, noted pt exhibited adequate functioning and task completion during basic level tasks; increased errors noted in higher level, more complex language tasks, including Fluency tasks where he produced more noun-verb phrases w/ increased word-finding hesitations and errors. Increased deficits were noted in Sequential Commands of multi-step(2+) commands, lengthier Repetition tasks, then x1 in Auditory Comprehension (y/n complex) task. During verbal decision making, min verbal cues were needed to elicit verbal responses for stating requests/wishes. During Object Description and likes/differences tasks when he had to use verbal language to express thoughts(Fluency), hesitations, semantic errors, and word-finding deficits noted. The complexity of tasks seem to indicate the degree of error -- pt appeared quite functional in basic language tasks and engagement. He responded in general social language re: self w/ no apparent deficits noted(when not challenged). Given general Semantic cues, repetition, and lessening complexity of tasks appeared to aid pt's success during verbal communication.  No overt, gross Cognitive deficits noted.  Pt appears to present w/ Dysarthria impacting his verbal communication at the word-conversation levels. When pt utilized strategies of slowing rate of speech, emphasizing speech sounds, and increasing volume(min), pt's intelligibility of speech improved. Pt presented w/ slight R lingual deviation w/ more Mild-Moderate deficits noted in R labial tone; no overt  oral motor weakness during tasks of lingual  protrusion and lateral reach/strength. Oral movements in rapid sequencing appeared to become disorganized. Noted improved oral motor strength/ROM w/ increased effort and slowing pace.  Recommend f/u w/ skilled ST services at Discharge in order to continue to assess expressive and receptive language abilities/deficits, address Dysarthria, and learn compensatory strategies in order to improve communication in ADLs. Handouts given.  CM/NSG/MD updated. Pt agreed.  SLP Assessment  SLP Recommendation/Assessment: All further Speech Lanaguage Pathology  needs can be addressed in the next venue of care SLP Visit Diagnosis: Aphasia (R47.01);Dysarthria and anarthria (R47.1)    Follow Up Recommendations  Skilled Nursing facility (vs HH vs Outpatient services)    Frequency and Duration  (TBD)   (TBD)      SLP Evaluation Cognition  Overall Cognitive Status: Within Functional Limits for tasks assessed Arousal/Alertness: Awake/alert Orientation Level: Oriented to person;Oriented to place;Oriented to situation Attention: Focused;Sustained Focused Attention: Appears intact Sustained Attention: Appears intact Awareness: Appears intact Problem Solving: Appears intact Executive Function: Reasoning;Sequencing (functional tasks) Reasoning: Appears intact Sequencing: Appears intact Behaviors:  (none) Safety/Judgment: Appears intact Comments: for functional situations       Comprehension  Auditory Comprehension Overall Auditory Comprehension: Impaired Yes/No Questions: Within Functional Limits Commands: Impaired Conversation: Simple EffectiveTechniques: Extra processing time;Repetition;Stressing words Visual Recognition/Discrimination Discrimination: Not tested Reading Comprehension Reading Status: Within funtional limits (word, phrase level)    Expression Expression Primary Mode of Expression: Verbal Verbal Expression Overall Verbal Expression:  Impaired Initiation: No impairment Automatic Speech: Name;Social Response;Counting;Day of week Level of Generative/Spontaneous Verbalization: Phrase Repetition: Impaired Level of Impairment: Sentence level Naming: Impairment Responsive: 51-75% accurate Confrontation: Impaired Verbal Errors: Aware of errors Pragmatics: No impairment Interfering Components: Speech intelligibility Effective Techniques: Semantic cues;Sentence completion Non-Verbal Means of Communication: Not applicable Other Verbal Expression Comments: Dysarthria+ Written Expression Dominant Hand: Right (but uses Left hand d/t weakness from previous CVA) Written Expression: Not tested (says he does not do much writing at all)   Oral / Motor  Oral Motor/Sensory Function Overall Oral Motor/Sensory Function: Mild impairment (-Moderate) Facial ROM: Reduced right Facial Symmetry: Abnormal symmetry right Facial Strength: Reduced right Lingual ROM: Within Functional Limits Lingual Symmetry: Abnormal symmetry right (slight) Lingual Strength: Within Functional Limits Velum: Within Functional Limits Mandible: Within Functional Limits Motor Speech Overall Motor Speech: Impaired Respiration: Within functional limits Phonation: Normal Resonance: Within functional limits Articulation: Impaired Level of Impairment: Word Intelligibility: Intelligibility reduced Word: 50-74% accurate Phrase: 50-74% accurate Interfering Components: Inadequate dentition;Premorbid status Effective Techniques: Slow rate;Increased vocal intensity;Over-articulate   GO                      Orinda Kenner, MS, CCC-SLP Speech Language Pathologist Rehab Services [phone] [facility] 08/11/2020, 1:19 PM

## 2020-08-11 NOTE — Evaluation (Addendum)
Occupational Therapy Evaluation Patient Details Name: Mark Fleming MRN: [id number] DOB: Aug 15, 1955 Today's Date: 08/11/2020    History of Present Illness Mark Fleming is a 65 y.o. with PMHx of  has a past medical history of Stroke ([place]). presenting with extension of L MCA stroke, anemia w/ c/f GI bleed. New acute L frontoparietal MCA.   Clinical Impression   Patient presenting with decreased I in self care, balance, functional transfer/mobility, endurance, and safety awareness. Patient with R hemiparesis at baseline. R UE with increased tone that pt reports attempting to use in some bilateral coordination tasks but pt performing one handed technique with self care tasks this session. Pt lives with his wife and mother in law. He reports wife assists him with IADL tasks but he is mod I with self care uses cane for functional mobility. Pt needing mod A for standing from EOB and to take a few side steps to the L. Pt with no buckling noted but does report decreased strength from baseline for R LE. Patient will benefit from acute OT to increase overall independence in the areas of ADLs, functional mobility, and safety awareness in order to safely discharge to next venue of care.    Follow Up Recommendations  Supervision/Assistance - 24 hour;CIR    Equipment Recommendations  Other (comment) (defer to next venue of care)       Precautions / Restrictions Precautions Precautions: Fall Restrictions Weight Bearing Restrictions: No      Mobility Bed Mobility Overal bed mobility: Needs Assistance Bed Mobility: Rolling;Supine to Sit;Sit to Supine Rolling: Min assist   Supine to sit: Min assist Sit to supine: Min assist        Transfers Overall transfer level: Needs assistance Equipment used: None Transfers: Sit to/from [facility] Sit to Stand: Mod assist Stand pivot transfers: Mod assist       General transfer comment: difficulty shifting weight to R LE. No  buckling noted    Balance Overall balance assessment: Needs assistance Sitting-balance support: Feet supported Sitting balance-Leahy Scale: Good     Standing balance support: During functional activity Standing balance-Leahy Scale: Poor                             ADL either performed or assessed with clinical judgement   ADL Overall ADL's : Needs assistance/impaired                     Lower Body Dressing: Minimal assistance Lower Body Dressing Details (indicate cue type and reason): Pt sitting on EOB with R LE into figure four position and use of 1 handed techique to don with increased time. Toilet Transfer: Moderate assistance;BSC;Ambulation Toilet Transfer Details (indicate cue type and reason): without use of AD                 Vision Patient Visual Report: No change from baseline              Pertinent Vitals/Pain Pain Assessment: Faces Faces Pain Scale: No hurt     Hand Dominance Left   Extremity/Trunk Assessment Upper Extremity Assessment Upper Extremity Assessment: RUE deficits/detail RUE Deficits / Details: limited ROM/increased tone at baseline from prior CVA. Pt reports he attempts B UE coordination tasks but uses L UE as dominant hand. Pt reports no additional weakness at this time.   Lower Extremity Assessment Lower Extremity Assessment: Defer to PT evaluation  Communication Communication Communication: Expressive difficulties;Other (comment) (at baseline from prior CVA)   Cognition Arousal/Alertness: Awake/alert Behavior During Therapy: WFL for tasks assessed/performed Overall Cognitive Status: Within Functional Limits for tasks assessed                                 General Comments: Pt is very pleasant. Expressive difficulties at baseline. Pt able to answer questions with increased time, selection of 2, and describing to therapist. Pt following command and sequencing with increased time.               Home Living Family/patient expects to be discharged to:: Private residence Living Arrangements: Spouse/significant other;Other relatives (lives with wife and mother in law (81 y/o)) Available Help at Discharge: Family;Available 24 hours/day Type of Home: House Home Access: Ramped entrance     Home Layout: One level     Bathroom Shower/Tub: Tub/shower unit         Home Equipment: Cane - quad;Shower seat;Wheelchair - manual          Prior Functioning/Environment Level of Independence: Independent with assistive device(s)        Comments: Pt reports being Mod I with self care tasks and functional mobility with use of cane. Pt reports wife performs all IADL tasks        OT Problem List: Decreased strength;Decreased range of motion;Decreased activity tolerance;Impaired balance (sitting and/or standing);Decreased coordination;Impaired tone;Decreased safety awareness;Impaired UE functional use;Decreased knowledge of use of DME or AE      OT Treatment/Interventions: Self-care/ADL training;Therapeutic exercise;Manual therapy;Patient/family education;Neuromuscular education;Balance training;Energy conservation;Therapeutic activities;DME and/or AE instruction    OT Goals(Current goals can be found in the care plan section) Acute Rehab OT Goals Patient Stated Goal: to get (R) leg better OT Goal Formulation: With patient Time For Goal Achievement: 08/25/20 Potential to Achieve Goals: Good ADL Goals Pt Will Perform Lower Body Dressing: with supervision;sit to/from stand Pt Will Transfer to Toilet: with supervision;ambulating Pt Will Perform Toileting - Clothing Manipulation and hygiene: with supervision;sit to/from stand  OT Frequency: Min 2X/week   Barriers to D/C:    none known at this time          AM-PAC OT "6 Clicks" Daily Activity     Outcome Measure Help from another person eating meals?: A Little Help from another person taking care of personal grooming?: A  Little Help from another person toileting, which includes using toliet, bedpan, or urinal?: A Lot Help from another person bathing (including washing, rinsing, drying)?: A Lot Help from another person to put on and taking off regular upper body clothing?: A Little Help from another person to put on and taking off regular lower body clothing?: A Little 6 Click Score: 16   End of Session Nurse Communication: Mobility status;Precautions  Activity Tolerance: Patient tolerated treatment well Patient left: in bed;with call bell/phone within reach;with bed alarm set  OT Visit Diagnosis: Unsteadiness on feet (R26.81);Repeated falls (R29.6);Muscle weakness (generalized) (M62.81)                Time: 1025-1050 OT Time Calculation (min): 25 min Charges:  OT General Charges $OT Visit: 1 Visit OT Evaluation $OT Eval Moderate Complexity: 1 Mod OT Treatments $Self Care/Home Management : 8-22 mins  Darleen Crocker, MS, OTR/L , CBIS ascom [phone]  08/11/20, 1:09 PM

## 2020-08-11 NOTE — Progress Notes (Addendum)
[facility] PROGRESS NOTE    Mark Fleming  UYQ:[id number] DOB: 08-23-1955 DOA: 08/10/2020 [facility]CP: [facility]      Brief Narrative:  Mark Fleming is a 65 y.o. M with hx CVA, prostate Ca on Zytiga, smoking and HTN who presented with speech disturbance for 2 days.  Wife noticed this day before admission, then noticed dragging his right leg more than usual.    In the ER, K 3.3, Hgb 7.0 g/dL, and CT head unremarkable.  Admitted for anemia and stroke symptoms.           Assessment & Plan:  Acute left frontoparietal ischemic CVA This is a watershed infarct given anemia, probably transient hypotension.  -Hgb threshold 8 -Echocardiogram ordered -Carotid imaging shows old occluded left ICA, <70% right -Lipids ordered: continue atorvastatin -Aspirin held at admission due to concern for GI bleed.  See below, resume aspirin now  -Atrial fibrillation: not present on tele -tPA not given because outside window -Dysphagia screen ordered in ER -PT eval ordered  -Smoking cessation: recommended, modalities discussed    Chronic blood loss anemia Iron deficiency anemia Has a microcytic anemia. Last record 2013, Hgb 13, microcytic MCV 79.    Transfused 1 unit yesterday.   Hgb up to 7.6 only.  No clinical bleeding, suspect a chronic GI bleed.  Folate normal. -Continue PPI for now -Consult GI, appreciate cares -Repeat CBC -Check iron studies, retics, smear, B12 -Hgb threshold 8 g/dL given stroke  ADDENDUM: Iron very low.   -Transfuse IV iron  Hgb still <8 -Transfuse second unit     Hyponatremia Mild, asymptomatic -Correct volume status with blood and trend  Hypokalemia -Supplement K -Check mag  Prostate cancer -Continue abiraterone -Continue prednisone  Hypertension History of cerebrovascular disease -Allow permissive HTN today and tomorrow, then normalize -Hold home lisinopril -Hold aspirin and Plavix -Start aspirin  monotherapy         Disposition: Status is: Inpatient  Remains inpatient appropriate because:still requiring transfusions   Dispo: The patient is from: Home              Anticipated d/c is to: SNF              Patient currently is not medically stable to d/c.   Difficult to place patient No   Patient amditted for stroke due to anemia, hypotension.  Still anemic to <8, requiring transfusion.  Discussed with anesthesia, Neuro and GI, given no active clinical bleeding and likely smoldering bleed, will switch from Plavix to aspirin and monitor.  If this triggers bleeding, will take for endoscopy.  Otherwise, will wit 30 days for endo, so that we minimize risk of extending stroke.      Will need snf, given signifcant new deficits from stroke, only wife (also with prior stroke) and 31 yo mother at home.    Level of care: Med-Surg       MDM: The below labs and imaging reports were reviewed and summarized above.  Medication management as above.     DVT prophylaxis: SCD's Start: 08/10/20 1358  Code Status: FULL Family Communication: Called to wife, no answer    Consultants:   Neurology  GI  Procedures:   3/17 MRI brain -- old left MCA stroke, new patchy left frontoparietal lobe stroke  3/17 US carotids -- occluded left, old, right <70  3/18 echo -- pending          Subjective: Feeling well.  Still dysarthric.  No fever,  confusion.  No cough, sputum.  No melena, hematochezia.  No cough.  Objective: Vitals:   08/11/20 0000 08/11/20 0046 08/11/20 0439 08/11/20 0720  BP:  (!) 120/57 (!) 97/51 111/61  Pulse: 68 66 61 (!) 57  Resp: 14 14 16 17   Temp:  99.3 F (37.4 C) 98.1 F (36.7 C) 98.4 F (36.9 C)  TempSrc:  Oral Oral Oral  SpO2: 97% 98% 99% 96%  Weight:  79.3 kg    Height:  5\' 10"  (1.778 m)      Intake/Output Summary (Last 24 hours) at 08/11/2020 1325 Last data filed at 08/11/2020 1002 Gross per 24 hour  Intake 684.67 ml  Output -  Net  684.67 ml   Filed Weights   08/10/20 0900 08/11/20 0046  Weight: 72.6 kg 79.3 kg    Examination: General appearance:  adult male, alert and in no acute distress.  Lying in bed HEENT: Anicteric, conjunctiva pink, lids and lashes normal. No nasal deformity, discharge, epistaxis.  Lips moist, mostly edentulous, OP moist, no oral lesions, hearing normal.   Skin: Warm and dry.  No jaundice.  No suspicious rashes or lesions. Cardiac: RRR, nl S1-S2, no murmurs appreciated.  Capillary refill is brisk.  JVP not visible.  No LE edema.  Radial pulses 2+ and symmetric. Respiratory: Normal respiratory rate and rhythm.  CTAB without rales or wheezes. Abdomen: Abdomen soft.  No TTP or guarding. No ascites, distension, hepatosplenomegaly.   MSK: No deformities or effusions. Neuro: Awake and alert.  EOMI, chronic right hemiparesis. Speech dysarthric.    Psych: Sensorium intact and responding to questions, attention normal. Affect normal.  Judgment and insight appear normal.    Data Reviewed: I have personally reviewed following labs and imaging studies:  CBC: Recent Labs  Lab 08/10/20 0914 08/11/20 0414  WBC 7.9 7.9  NEUTROABS 5.2  --   HGB 7.0* 7.6*  HCT 26.5* 26.6*  MCV 67.1* 68.7*  PLT 372 956   Basic Metabolic Panel: Recent Labs  Lab 08/10/20 0914 08/11/20 0414  NA 130* 131*  K 3.3* 3.6  CL 95* 99  CO2 26 25  GLUCOSE 112* 84  BUN 5* 6*  CREATININE 0.76 0.58*  CALCIUM 9.0 8.9  MG  --  1.8   GFR: Estimated Creatinine Clearance: 96.3 mL/min (A) (by C-G formula based on SCr of 0.58 mg/dL (L)). Liver Function Tests: Recent Labs  Lab 08/10/20 0914  AST 46*  ALT 36  ALKPHOS 73  BILITOT 0.7  PROT 6.9  ALBUMIN 3.6   No results for input(s): LIPASE, AMYLASE in the last 168 hours. No results for input(s): AMMONIA in the last 168 hours. Coagulation Profile: Recent Labs  Lab 08/10/20 0914  INR 1.1   Cardiac Enzymes: No results for input(s): CKTOTAL, CKMB, CKMBINDEX,  TROPONINI in the last 168 hours. BNP (last 3 results) No results for input(s): PROBNP in the last 8760 hours. HbA1C: Recent Labs    08/11/20 0414  HGBA1C 5.1   CBG: No results for input(s): GLUCAP in the last 168 hours. Lipid Profile: Recent Labs    08/11/20 0414  CHOL 84  HDL 24*  LDLCALC 46  TRIG 69  CHOLHDL 3.5   Thyroid Function Tests: Recent Labs    08/11/20 0412  TSH 0.708   Anemia Panel: Recent Labs    08/11/20 0414 08/11/20 1245  FOLATE 11.2  --   FERRITIN 10*  --   TIBC 363  --   IRON 70  --   RETICCTPCT  --  1.3   Urine analysis:    Component Value Date/Time   COLORURINE Straw 10/05/2011 2030   APPEARANCEUR Clear 10/05/2011 2030   LABSPEC 1.006 10/05/2011 2030   PHURINE 6.0 10/05/2011 2030   GLUCOSEU Negative 10/05/2011 2030   HGBUR Negative 10/05/2011 2030   BILIRUBINUR Negative 10/05/2011 2030   KETONESUR Negative 10/05/2011 2030   PROTEINUR Negative 10/05/2011 2030   NITRITE Negative 10/05/2011 2030   LEUKOCYTESUR Negative 10/05/2011 2030   Sepsis Labs: @LABRCNTIP (procalcitonin:4,lacticacidven:4)  ) Recent Results (from the past 240 hour(s))  SARS CORONAVIRUS 2 (TAT 6-24 HRS) Nasopharyngeal Nasopharyngeal Swab     Status: None   Collection Time: 08/10/20 10:35 AM   Specimen: Nasopharyngeal Swab  Result Value Ref Range Status   SARS Coronavirus 2 NEGATIVE NEGATIVE Final    Comment: (NOTE) SARS-CoV-2 target nucleic acids are NOT DETECTED.  The SARS-CoV-2 RNA is generally detectable in upper and lower respiratory specimens during the acute phase of infection. Negative results do not preclude SARS-CoV-2 infection, do not rule out co-infections with other pathogens, and should not be used as the sole basis for treatment or other patient management decisions. Negative results must be combined with clinical observations, patient history, and epidemiological information. The expected result is Negative.  Fact Sheet for  Patients: SugarRoll.be  Fact Sheet for Healthcare Providers: https://www.woods-mathews.com/  This test is not yet approved or cleared by the [place] FDA and  has been authorized for detection and/or diagnosis of SARS-CoV-2 by FDA under an Emergency Use Authorization (EUA). This EUA will remain  in effect (meaning this test can be used) for the duration of the COVID-19 declaration under Se ction 564(b)(1) of the Act, 21 U.S.C. section 360bbb-3(b)(1), unless the authorization is terminated or revoked sooner.  Performed at [facility], [place] [address] [place], [place] [address]          Radiology Studies: CT Head Wo Contrast  Result Date: 08/10/2020 CLINICAL DATA:  Neuro deficit, acute, stroke suspected. Additional history provided: Right-sided weakness, facial droop, new onset confusion. EXAM: CT HEAD WITHOUT CONTRAST TECHNIQUE: Contiguous axial images were obtained from the base of the skull through the vertex without intravenous contrast. COMPARISON:  Brain MRI 10/06/2011.  Head CT 10/05/2011. FINDINGS: Brain: Mild cerebral and cerebellar atrophy. Large region of encephalomalacia and gliosis within the left frontal lobe and insula (ACA and MCA vascular territories), likely reflecting a chronic infarct. Infarction changes have significantly increased in extent as compared to the brain MRI of 10/06/2011. Background mild ill-defined hypoattenuation within the cerebral white matter is nonspecific, but compatible with chronic small vessel ischemic disease. Redemonstrated chronic infarcts within the right cerebellar hemisphere. There is no acute intracranial hemorrhage. No acute demarcated cortical infarct is identified. No extra-axial fluid collection. No evidence of intracranial mass. No midline shift. Mega cisterna magna. Vascular: Atherosclerotic calcifications. Skull: Normal. Negative for fracture or focal lesion. Sinuses/Orbits:  Visualized orbits show no acute finding. No significant paranasal sinus disease at the imaged levels. IMPRESSION: No evidence of acute intracranial abnormality. Large chronic infarct within the left frontal lobe and insula (ACA and MCA vascular territories), increased in extent as compared to the brain MRI of 10/06/2011. Progressive mild generalized parenchymal atrophy and mild cerebral white matter chronic small vessel ischemic disease. Redemonstrated chronic infarcts within the right cerebellar hemisphere. Electronically Signed   By: Kellie Simmering DO   On: 08/10/2020 09:50   MR BRAIN WO CONTRAST  Result Date: 08/10/2020 CLINICAL DATA:  Abnormal speech, history of stroke EXAM: MRI  HEAD WITHOUT CONTRAST TECHNIQUE: Multiplanar, multiecho pulse sequences of the brain and surrounding structures were obtained without intravenous contrast. COMPARISON:  2014 FINDINGS: Brain: There is cortical restricted diffusion in the left frontoparietal lobes including involvement of precentral and postcentral gyri. Large chronic infarction anterior to this region involving the frontal lobe ACA and MCA territories with chronic blood products and associated volume loss. Additional chronic right cerebellar infarcts. Other patchy T2 hyperintensity in the supratentorial white matter likely reflects chronic microvascular ischemic changes. There is no intracranial mass or mass effect. There is no hydrocephalus or extra-axial fluid collection. Prominence of the ventricles and sulci reflects parenchymal volume loss. Vascular: Diminished left ICA flow void in keeping with prior CTA findings. Major vessel flow voids at the skull base are otherwise preserved. Skull and upper cervical spine: Normal marrow signal is preserved. Sinuses/Orbits: Paranasal sinuses are aerated. Orbits are unremarkable. Other: Sella is unremarkable.  Mastoid air cells are clear. IMPRESSION: Small areas of acute cortical infarction in the left frontoparietal lobes  involving MCA territory. Large chronic left ACA and MCA territory infarcts. Multiple chronic right cerebellar infarcts. Electronically Signed   By: Macy Mis M.D.   On: 08/10/2020 16:59   US Carotid Bilateral  Result Date: 08/10/2020 CLINICAL DATA:  Acute left MCA territory stroke, hypertension, hyperlipidemia, tobacco abuse EXAM: BILATERAL CAROTID DUPLEX ULTRASOUND TECHNIQUE: Pearline Cables scale imaging, color Doppler and duplex ultrasound were performed of bilateral carotid and vertebral arteries in the neck. COMPARISON:  10/07/2011 FINDINGS: Criteria: Quantification of carotid stenosis is based on velocity parameters that correlate the residual internal carotid diameter with NASCET-based stenosis levels, using the diameter of the distal internal carotid lumen as the denominator for stenosis measurement. The following velocity measurements were obtained: RIGHT ICA: 141/53 cm/sec CCA: 03/49 cm/sec SYSTOLIC ICA/CCA RATIO:  2.1 ECA: 121 cm/sec LEFT ICA: Occluded cm/sec CCA: 179/1 cm/sec SYSTOLIC ICA/CCA RATIO:  0 ECA: 120 cm/sec RIGHT CAROTID ARTERY: Antegrade, minimal atheromatous plaque. RIGHT VERTEBRAL ARTERY:  Antegrade LEFT CAROTID ARTERY:  Antegrade, minimal atheromatous plaque. LEFT VERTEBRAL ARTERY:  Antegrade IMPRESSION: 1. Chronic occlusion of the left internal carotid artery, identified on prior CT a neck 10/07/2011. 2. Estimated 50-69% stenosis within the right ICA based on peak systolic velocity. Electronically Signed   By: Randa Ngo M.D.   On: 08/10/2020 19:17        Scheduled Meds: .  stroke: mapping our early stages of recovery book   Does not apply Once  . sodium chloride   Intravenous Once  . abiraterone acetate  1,000 mg Oral Daily  . atorvastatin  80 mg Oral QHS  . cholecalciferol  2,000 Units Oral Daily  . magnesium oxide  400 mg Oral BID  . nicotine  21 mg Transdermal Daily  . pantoprazole (PROTONIX) IV  40 mg Intravenous Q12H  . potassium chloride  40 mEq Oral Once  .  predniSONE  5 mg Oral BID WC  . vitamin B-12  1,000 mcg Oral Daily   Continuous Infusions: . sodium chloride 10 mL/hr at 08/11/20 0020     LOS: 1 day    Time spent: 35 minutes    Edwin Dada, MD [facility] 08/11/2020, 1:25 PM     Please page though [place] or Epic secure chat:  For [employer], Adult nurse

## 2020-08-11 NOTE — TOC Initial Note (Signed)
Transition of Care [facility]) - Initial/Assessment Note    Patient Details  Name: Mark Fleming MRN: [id number] Date of Birth: 1956/03/01  Transition of Care [facility]) CM/SW Contact:    Shelbie Hutching, RN Phone Number: 08/11/2020, 3:12 PM  Clinical Narrative:                 Patient admitted to the hospital with stroke, from home with wife where he is normally able to ambulate with a cane and is independent in ADL's.  Patient also has a walker at home.  Patient goes to the [facility] for services and gets prescriptions from there.  Patient's wife provides all transportation patient does not drive.  Patient declines SNF as recommended by OT and prefers to go home with home health services.  [facility] accepted referral for RN, PT, OT and speech.  Plan for discharge tomorrow.   Expected Discharge Plan: [place] Barriers to Discharge: Continued Medical Work up   Patient Goals and CMS Choice Patient states their goals for this hospitalization and ongoing recovery are:: Wants to be able to talk better- expresive aphasia from stroke CMS Medicare.gov Compare Post Acute Care list provided to:: Patient Choice offered to / list presented to : Patient  Expected Discharge Plan and Services Expected Discharge Plan: [place]   Discharge Planning Services: CM Consult Post Acute Care Choice: [place] arrangements for the past 2 months: Single Family Home                 DME Arranged: N/A DME Agency: NA       HH Arranged: RN,PT,OT,Nurse's Aide HH Agency: [facility] ([place]) Date HH Agency Contacted: 08/11/20 Time HH Agency Contacted: 70 Representative spoke with at [place]: Corene Cornea  Prior Living Arrangements/Services Living arrangements for the past 2 months: [place] with:: Spouse,Relatives Patient language and need for interpreter reviewed:: Yes Do you feel safe going back to the place where you live?: Yes       Need for Family Participation in Patient Care: Yes (Comment) (stroke) Care giver support system in place?: Yes (comment) (wife) Current home services: DME (walker and cane) Criminal Activity/Legal Involvement Pertinent to Current Situation/Hospitalization: No - Comment as needed  Activities of Daily Living Home Assistive Devices/Equipment: Cane (specify quad or straight),Walker (specify type),Wheelchair ADL Screening (condition at time of admission) Patient's cognitive ability adequate to safely complete daily activities?: Yes Is the patient deaf or have difficulty hearing?: No Does the patient have difficulty seeing, even when wearing glasses/contacts?: No Does the patient have difficulty concentrating, remembering, or making decisions?: No Patient able to express need for assistance with ADLs?: Yes Does the patient have difficulty dressing or bathing?: No Independently performs ADLs?: Yes (appropriate for developmental age) Does the patient have difficulty walking or climbing stairs?: Yes Weakness of Legs: Right Weakness of Arms/Hands: Right  Permission Sought/Granted Permission sought to share information with : Case Manager,Family Supports,Other (comment) Permission granted to share information with : Yes, Verbal Permission Granted  Share Information with NAME: Chidubem Chaires  Permission granted to share info w AGENCY: Home Health agencies  Permission granted to share info w Relationship: wife     Emotional Assessment              Admission diagnosis:  Aphasia [R47.01] Carotid artery stenosis [I65.29] CVA (cerebral vascular accident) ([place]) [I63.9] Generalized weakness [R53.1] Gastrointestinal hemorrhage, unspecified gastrointestinal hemorrhage type [K92.2] Patient Active Problem List  Diagnosis Date Noted  . CVA (cerebral vascular accident) ([place]) 08/10/2020  . Acute blood loss anemia 08/10/2020  . Nicotine dependence 08/10/2020  . Prostate cancer ([place]) 08/10/2020    PCP:  [facility] [place]:   [place], [address] [place] [address] Phone: [phone] Fax: [phone]     Social Determinants of Health (SDOH) Interventions    Readmission Risk Interventions No flowsheet data found.

## 2020-08-11 NOTE — Progress Notes (Signed)
Rehab Admissions Coordinator Note:  Patient was screened by Cleatrice Burke for appropriateness for an Inpatient Acute Rehab Consult per therapy recommendations. .  At this time, we are recommending Inpatient Rehab consult. I will place order for consult per protocol and an Admissions Coordinator will follow up for full assessment this weekend.  Cleatrice Burke RN MSN 08/11/2020, 4:24 PM  I can be reached at [phone].

## 2020-08-11 NOTE — Progress Notes (Signed)
Arrived to unit via bed. Skin swarm completed with Sue Lush, RN. No skin issues identified. Oriented to room and call light system. Family at bedside.

## 2020-08-12 LAB — COMPREHENSIVE METABOLIC PANEL
ALT: 24 U/L (ref 0–44)
AST: 27 U/L (ref 15–41)
Albumin: 3.3 g/dL — ABNORMAL LOW (ref 3.5–5.0)
Alkaline Phosphatase: 61 U/L (ref 38–126)
Anion gap: 8 (ref 5–15)
BUN: 7 mg/dL — ABNORMAL LOW (ref 8–23)
CO2: 24 mmol/L (ref 22–32)
Calcium: 9.2 mg/dL (ref 8.9–10.3)
Chloride: 102 mmol/L (ref 98–111)
Creatinine, Ser: 0.74 mg/dL (ref 0.61–1.24)
GFR, Estimated: >60 mL/min (ref >60)
Glucose, Bld: 91 mg/dL (ref 70–99)
Potassium: 4.1 mmol/L (ref 3.5–5.1)
Sodium: 134 mmol/L — ABNORMAL LOW (ref 135–145)
Total Bilirubin: 2 mg/dL — ABNORMAL HIGH (ref 0.3–1.2)
Total Protein: 5.8 g/dL — ABNORMAL LOW (ref 6.5–8.1)

## 2020-08-12 LAB — CBC
HCT: 28.4 % — ABNORMAL LOW (ref 39.0–52.0)
Hemoglobin: 8.1 g/dL — ABNORMAL LOW (ref 13.0–17.0)
MCH: 20.4 pg — ABNORMAL LOW (ref 26.0–34.0)
MCHC: 28.5 g/dL — ABNORMAL LOW (ref 30.0–36.0)
MCV: 71.5 fL — ABNORMAL LOW (ref 80.0–100.0)
Platelets: 299 10*3/uL (ref 150–400)
RBC: 3.97 MIL/uL — ABNORMAL LOW (ref 4.22–5.81)
RDW: 21.9 % — ABNORMAL HIGH (ref 11.5–15.5)
WBC: 8.5 10*3/uL (ref 4.0–10.5)
nRBC: 0 % (ref 0.0–0.2)

## 2020-08-12 MED ORDER — ASPIRIN 81 MG PO CHEW
81.0000 mg | CHEWABLE_TABLET | Freq: Every day | ORAL | Status: AC
Start: 2020-08-13 — End: ?

## 2020-08-12 MED ORDER — PANTOPRAZOLE SODIUM 20 MG PO TBEC: 20.0000 mg | DELAYED_RELEASE_TABLET | Freq: Two times a day (BID) | ORAL | 0 refills | Status: AC

## 2020-08-12 MED ORDER — DOCUSATE SODIUM 100 MG PO CAPS: 100.0000 mg | ORAL_CAPSULE | Freq: Every day | ORAL | 2 refills | Status: AC | PRN

## 2020-08-12 MED ORDER — FERROUS SULFATE 325 (65 FE) MG PO TBEC: 325.0000 mg | DELAYED_RELEASE_TABLET | Freq: Two times a day (BID) | ORAL | 1 refills | Status: AC

## 2020-08-12 NOTE — Progress Notes (Signed)
Received MD order to discharge  patient to home with Home Health.  Reveiwed discharge instructions, prescriptions, home meds and follow up appointments with patient and wife and both  verbalized understanding.

## 2020-08-12 NOTE — Progress Notes (Signed)
Physical Therapy Treatment Patient Details Name: Mark Fleming MRN: [id number] DOB: 03/09/1956 Today's Date: 08/12/2020    History of Present Illness Pt is a 65 y/o M admitted on 08/10/20 with c/c of slurred speech & RLE weakness. MRI reveals small acute corrical infarct in L frontoparietal lobes. GI is being consulted for anemia and positive FOBT. PMH: CVA with R sided hemiparesis, nicotine dependence, HTN, dyslipidemia, prostate CA    PT Comments    Pt seen for PT evaluation with wife present for session. Pt is able to ambulate with rail in hallway with min assist x 18 ft + 18 ft with chair follow for safety. Pt with improved ability to weight bear through RLE without any buckling noted but still heavy reliance on rail and decreased weight shifting to R. Spent significant time educating pt & wife on d/c options with ongoing recommendation of CIR to allow pt to return to mod I ambulatory level, however pt is adamant about going home & says he's okay with using a w/c for mobility. Pt is able to demonstrate stand pivot to L with CGA but requires mod assist to complete stand pivots to R. Educated pt's wife on increased safety of pt transferring to L but the environment may not always be accommodating to this (I.e. exiting a vehicle). Educated them on recommendation of pt going home at w/c level with stand pivot transfers & to use BSC vs ambulating into the bathroom. Also discussed increased risk of another stroke as pt has already had 2 & pt eager to return home & smoke (nurse made aware & provided pt with nicotine gum). Will continue to follow pt acutely to progress gait with LRAD & address balance & RLE NMR.   Since pt is declining CIR, changed recommendation to HHPT f/u.    Follow Up Recommendations  Home health PT;Supervision for mobility/OOB     Equipment Recommendations  None recommended by PT (pt already has w/c, BSC)    Recommendations for Other Services       Precautions /  Restrictions Precautions Precautions: Fall Precaution Comments: R hemi Restrictions Weight Bearing Restrictions: No    Mobility  Bed Mobility               General bed mobility comments: not observed as pt received sitting EOB    Transfers Overall transfer level: Needs assistance Equipment used: None Transfers: Sit to/from [facility] Sit to Stand: Min assist Stand pivot transfers: Mod assist;Min guard       General transfer comment: stand pivot to L without AD, stand pivot to R with mod assist as pt with difficulty stepping to R & weight shifting R  Ambulation/Gait Ambulation/Gait assistance: Min assist;+2 safety/equipment (min assist + chair follow for safety) Gait Distance (Feet): 18 Feet (+ 18 ft) Assistive device:  (rail in hallway) Gait Pattern/deviations: Decreased step length - right;Decreased stance time - right;Decreased dorsiflexion - right;Decreased weight shift to right Gait velocity: decreased   General Gait Details: Pt is able to advance RLE with tactile cuing at R knee with cuing for increased knee extension (still unable to actively fully extend knee) but no buckling noted on this date   Stairs             Wheelchair Mobility    Modified Rankin (Stroke Patients Only)       Balance Overall balance assessment: Needs assistance Sitting-balance support: Feet supported Sitting balance-Leahy Scale: Good     Standing balance support: During functional activity;No  upper extremity supported Standing balance-Leahy Scale: Fair                              Cognition Arousal/Alertness: Awake/alert Behavior During Therapy: WFL for tasks assessed/performed Overall Cognitive Status: Within Functional Limits for tasks assessed                                 General Comments: Agreeable to tx. Baseline expressive difficulties but wife notes they are worse following this medical event.      Exercises       General Comments        Pertinent Vitals/Pain Pain Assessment: 0-10 Pain Score: 3  Pain Location: R foot Pain Descriptors / Indicators: Sore Pain Intervention(s): Monitored during session;Limited activity within patient's tolerance;RN gave pain meds during session    Home Living                      Prior Function            PT Goals (current goals can now be found in the care plan section) Acute Rehab PT Goals Patient Stated Goal: to go home PT Goal Formulation: With patient/family Time For Goal Achievement: 08/25/20 Potential to Achieve Goals: Good Progress towards PT goals: Progressing toward goals    Frequency    7X/week      PT Plan Discharge plan needs to be updated    Co-evaluation              AM-PAC PT "6 Clicks" Mobility   Outcome Measure  Help needed turning from your back to your side while in a flat bed without using bedrails?: None Help needed moving from lying on your back to sitting on the side of a flat bed without using bedrails?: A Little Help needed moving to and from a bed to a chair (including a wheelchair)?: A Lot Help needed standing up from a chair using your arms (e.g., wheelchair or bedside chair)?: A Little Help needed to walk in hospital room?: A Lot Help needed climbing 3-5 steps with a railing? : Total 6 Click Score: 15    End of Session Equipment Utilized During Treatment: Gait belt Activity Tolerance: Patient tolerated treatment well Patient left: in chair;with call bell/phone within reach;with chair alarm set;with family/visitor present Nurse Communication: Mobility status PT Visit Diagnosis: Unsteadiness on feet (R26.81);Muscle weakness (generalized) (M62.81);Difficulty in walking, not elsewhere classified (R26.2);Hemiplegia and hemiparesis Hemiplegia - Right/Left: Right Hemiplegia - dominant/non-dominant: Dominant Hemiplegia - caused by: Cerebral infarction     Time: 0354-6568 PT Time Calculation  (min) (ACUTE ONLY): 39 min  Charges:  $Therapeutic Activity: 23-37 mins $Neuromuscular Re-education: 8-22 mins                     Mark Fleming, PT, DPT 08/12/20, 11:33 AM    Mark Fleming 08/12/2020, 11:28 AM

## 2020-08-12 NOTE — Discharge Summary (Signed)
Physician Discharge Summary  Mark Fleming PNT:[id number] DOB: 1955-11-12 DOA: 08/10/2020  PCP: [facility]  Admit date: 08/10/2020 Discharge date: 08/12/2020  Admitted From: Home  Disposition:  Home with home health   Recommendations for Outpatient Follow-up:  1. Follow up with PCP in 1 week 2. Follow up with GI for endoscopy in 30 days 3. Follow up with Neurology in 4 weeks 4. PCP: Please obtain CBC in 1 week to check Hgb and BMP to check K       Home Health: PT/OT/SW/RN due to ongoing balance issues with walking  Equipment/Devices: None new  Discharge Condition: Fair  CODE STATUS: FULL Diet recommendation: Cardiac  Brief/Interim Summary: Mark Fleming is a 65 y.o. M with hx CVA, prostate Ca on Zytiga, smoking and HTN who presented with speech disturbance for 2 days.  Wife noticed this day before admission, then noticed dragging his right leg more than usual.    In the ER, K 3.3, Hgb 7.0 g/dL, and CT head unremarkable.  Admitted for anemia and stroke symptoms.       PRINCIPAL HOSPITAL DIAGNOSIS: Acute left frontoparietal ischemic CVA    Discharge Diagnoses:   Acute left frontoparietal ischemic CVA This is a watershed infarct given anemia, probably transient hypotension.  Echo showed no cardiac source of embolism.  Carotid imaging showed old occluded left ICA, <70% right.  Smoking cessation: recommended, modalities discussed  Transitioned to aspirin 81 from Plavix, given iron deficiency anemia.      Worsening chronic blood loss anemia Iron deficiency anemia Patient with chronic microcytosis, but here, Hgb <7 g/dL and MCV 60s.  Ferritin and iron sats confirmed iron deficiency.  No clinical bleeding noted at any time, this appears to be a slow gradual chronic bleed.  Transfused 2 units and Hgb > 8 d/L.  Given iron infusion and started on oral iron.  Follow up with PCP for Hgb in 1 week, GI for endoscopy in 30 days (given recent  stroke)      Hyponatremia  Hypokalemia Treated   Prostate cancer On abiraterone and prednisone   Hypertension History of cerebrovascular disease BP here soft off lisinopril.    Lisinopril stopped due to hypotension causing watershed infarct.                   Discharge Instructions  Discharge Instructions    Diet - low sodium heart healthy   Complete by: As directed    Discharge instructions   Complete by: As directed    From Dr. Loleta Books: You were admitted for a small stroke, which was caused by anemia (loss of blood) as well as low blood pressure.   For the stroke: Work with physical therapy to learn strengthening exercises for your right leg Work on off days to make sure you make good progress  ALSO: Stop taking clopidogrel/Plavix Start taking aspirin 81 mg daily  STOP taking lisinopril for now Discuss with your primary care that you had a stroke from too low blood pressure and ask if you should or shouldn't restart lisinopril  Continue your cholesterol medicine  STOP smoking!   For the low blood level and anemia: Take ferrous sulfate 325 mg twice daily This is an iron supplement It makes you constipated, so take it with docusate/Colace 100 mg (a stool softener)  Take an antacid medicine pantoprazole/Protonix 20 mg twice daily util you see Dr. Bonna Gains  Go see Dr. Bonna Gains in 1 month   Increase activity slowly   Complete  by: As directed      Allergies as of 08/12/2020   No Known Allergies     Medication List    STOP taking these medications   clopidogrel 75 MG tablet Commonly known as: PLAVIX   lisinopril 40 MG tablet Commonly known as: ZESTRIL     TAKE these medications   abiraterone acetate 250 MG tablet Commonly known as: ZYTIGA Take 1,000 mg by mouth daily. Take on an empty stomach 1 hour before or 2 hours after a meal   aspirin 81 MG chewable tablet Chew 1 tablet (81 mg total) by mouth daily. Start taking on:  August 13, 2020   atorvastatin 80 MG tablet Commonly known as: LIPITOR Take 80 mg by mouth at bedtime.   cholecalciferol 25 MCG (1000 UNIT) tablet Commonly known as: VITAMIN D3 Take 2,000 Units by mouth daily.   docusate sodium 100 MG capsule Commonly known as: Colace Take 1 capsule (100 mg total) by mouth daily as needed.   ferrous sulfate 325 (65 FE) MG EC tablet Take 1 tablet (325 mg total) by mouth 2 (two) times daily.   pantoprazole 20 MG tablet Commonly known as: Protonix Take 1 tablet (20 mg total) by mouth 2 (two) times daily before a meal.   predniSONE 5 MG tablet Commonly known as: DELTASONE Take 5 mg by mouth in the morning and at bedtime.   vitamin B-12 1000 MCG tablet Commonly known as: CYANOCOBALAMIN Take 1,000 mcg by mouth daily.       No Known Allergies  Consultations:  Neurology  GI   Procedures/Studies: CT Head Wo Contrast  Result Date: 08/10/2020 CLINICAL DATA:  Neuro deficit, acute, stroke suspected. Additional history provided: Right-sided weakness, facial droop, new onset confusion. EXAM: CT HEAD WITHOUT CONTRAST TECHNIQUE: Contiguous axial images were obtained from the base of the skull through the vertex without intravenous contrast. COMPARISON:  Brain MRI 10/06/2011.  Head CT 10/05/2011. FINDINGS: Brain: Mild cerebral and cerebellar atrophy. Large region of encephalomalacia and gliosis within the left frontal lobe and insula (ACA and MCA vascular territories), likely reflecting a chronic infarct. Infarction changes have significantly increased in extent as compared to the brain MRI of 10/06/2011. Background mild ill-defined hypoattenuation within the cerebral white matter is nonspecific, but compatible with chronic small vessel ischemic disease. Redemonstrated chronic infarcts within the right cerebellar hemisphere. There is no acute intracranial hemorrhage. No acute demarcated cortical infarct is identified. No extra-axial fluid collection. No  evidence of intracranial mass. No midline shift. Mega cisterna magna. Vascular: Atherosclerotic calcifications. Skull: Normal. Negative for fracture or focal lesion. Sinuses/Orbits: Visualized orbits show no acute finding. No significant paranasal sinus disease at the imaged levels. IMPRESSION: No evidence of acute intracranial abnormality. Large chronic infarct within the left frontal lobe and insula (ACA and MCA vascular territories), increased in extent as compared to the brain MRI of 10/06/2011. Progressive mild generalized parenchymal atrophy and mild cerebral white matter chronic small vessel ischemic disease. Redemonstrated chronic infarcts within the right cerebellar hemisphere. Electronically Signed   By: Kellie Simmering DO   On: 08/10/2020 09:50   MR BRAIN WO CONTRAST  Result Date: 08/10/2020 CLINICAL DATA:  Abnormal speech, history of stroke EXAM: MRI HEAD WITHOUT CONTRAST TECHNIQUE: Multiplanar, multiecho pulse sequences of the brain and surrounding structures were obtained without intravenous contrast. COMPARISON:  2014 FINDINGS: Brain: There is cortical restricted diffusion in the left frontoparietal lobes including involvement of precentral and postcentral gyri. Large chronic infarction anterior to this region involving the frontal lobe  ACA and MCA territories with chronic blood products and associated volume loss. Additional chronic right cerebellar infarcts. Other patchy T2 hyperintensity in the supratentorial white matter likely reflects chronic microvascular ischemic changes. There is no intracranial mass or mass effect. There is no hydrocephalus or extra-axial fluid collection. Prominence of the ventricles and sulci reflects parenchymal volume loss. Vascular: Diminished left ICA flow void in keeping with prior CTA findings. Major vessel flow voids at the skull base are otherwise preserved. Skull and upper cervical spine: Normal marrow signal is preserved. Sinuses/Orbits: Paranasal sinuses are  aerated. Orbits are unremarkable. Other: Sella is unremarkable.  Mastoid air cells are clear. IMPRESSION: Small areas of acute cortical infarction in the left frontoparietal lobes involving MCA territory. Large chronic left ACA and MCA territory infarcts. Multiple chronic right cerebellar infarcts. Electronically Signed   By: Macy Mis M.D.   On: 08/10/2020 16:59   US Carotid Bilateral  Result Date: 08/10/2020 CLINICAL DATA:  Acute left MCA territory stroke, hypertension, hyperlipidemia, tobacco abuse EXAM: BILATERAL CAROTID DUPLEX ULTRASOUND TECHNIQUE: Pearline Cables scale imaging, color Doppler and duplex ultrasound were performed of bilateral carotid and vertebral arteries in the neck. COMPARISON:  10/07/2011 FINDINGS: Criteria: Quantification of carotid stenosis is based on velocity parameters that correlate the residual internal carotid diameter with NASCET-based stenosis levels, using the diameter of the distal internal carotid lumen as the denominator for stenosis measurement. The following velocity measurements were obtained: RIGHT ICA: 141/53 cm/sec CCA: 66/44 cm/sec SYSTOLIC ICA/CCA RATIO:  2.1 ECA: 121 cm/sec LEFT ICA: Occluded cm/sec CCA: 034/7 cm/sec SYSTOLIC ICA/CCA RATIO:  0 ECA: 120 cm/sec RIGHT CAROTID ARTERY: Antegrade, minimal atheromatous plaque. RIGHT VERTEBRAL ARTERY:  Antegrade LEFT CAROTID ARTERY:  Antegrade, minimal atheromatous plaque. LEFT VERTEBRAL ARTERY:  Antegrade IMPRESSION: 1. Chronic occlusion of the left internal carotid artery, identified on prior CT a neck 10/07/2011. 2. Estimated 50-69% stenosis within the right ICA based on peak systolic velocity. Electronically Signed   By: Randa Ngo M.D.   On: 08/10/2020 19:17   ECHOCARDIOGRAM COMPLETE  Result Date: 08/11/2020    ECHOCARDIOGRAM REPORT   Patient Name:   Mark Fleming Date of Exam: 08/11/2020 Medical Rec #:  [id number]      Height:       70.0 in Accession #:    [id number]     Weight:       174.8 lb Date of Birth:   07-27-1955      BSA:          1.971 m Patient Age:    29 years       BP:           111/61 mmHg Patient Gender: M              HR:           57 bpm. Exam Location:  [facility] Procedure: 2D Echo, Cardiac Doppler, Color Doppler and Strain Analysis Indications:     Stroke I63.9  History:         Patient has no prior history of Echocardiogram examinations.                  Stroke.  Sonographer:     Sherrie Sport RDCS (AE) Referring Phys:  [id number] Collier Bullock Diagnosing Phys: Serafina Royals MD  Sonographer Comments: Global longitudinal strain was attempted. IMPRESSIONS  1. Left ventricular ejection fraction, by estimation, is 55 to 60%. The left ventricle has normal function. The left ventricle has no regional wall motion abnormalities. Left  ventricular diastolic parameters were normal.  2. Right ventricular systolic function is normal. The right ventricular size is normal.  3. The mitral valve is normal in structure. Trivial mitral valve regurgitation.  4. The aortic valve is normal in structure. Aortic valve regurgitation is trivial. FINDINGS  Left Ventricle: Left ventricular ejection fraction, by estimation, is 55 to 60%. The left ventricle has normal function. The left ventricle has no regional wall motion abnormalities. The left ventricular internal cavity size was normal in size. There is  no left ventricular hypertrophy. Left ventricular diastolic parameters were normal. Right Ventricle: The right ventricular size is normal. No increase in right ventricular wall thickness. Right ventricular systolic function is normal. Left Atrium: Left atrial size was normal in size. Right Atrium: Right atrial size was normal in size. Pericardium: There is no evidence of pericardial effusion. Mitral Valve: The mitral valve is normal in structure. Trivial mitral valve regurgitation. Tricuspid Valve: The tricuspid valve is normal in structure. Tricuspid valve regurgitation is trivial. Aortic Valve: The aortic valve is normal in  structure. Aortic valve regurgitation is trivial. Aortic valve mean gradient measures 3.0 mmHg. Aortic valve peak gradient measures 5.1 mmHg. Aortic valve area, by VTI measures 3.70 cm. Pulmonic Valve: The pulmonic valve was normal in structure. Pulmonic valve regurgitation is not visualized. Aorta: The aortic root and ascending aorta are structurally normal, with no evidence of dilitation. IAS/Shunts: No atrial level shunt detected by color flow Doppler.  LEFT VENTRICLE PLAX 2D LVIDd:         3.80 cm  Diastology LVIDs:         2.39 cm  LV e' medial:    12.70 cm/s LV PW:         1.32 cm  LV E/e' medial:  5.1 LV IVS:        1.06 cm  LV e' lateral:   10.80 cm/s LVOT diam:     2.20 cm  LV E/e' lateral: 6.0 LV SV:         86 LV SV Index:   44 LVOT Area:     3.80 cm                          3D Volume EF:                         3D EF:        71 %                         LV EDV:       113 ml                         LV ESV:       33 ml                         LV SV:        79 ml RIGHT VENTRICLE RV Basal diam:  3.15 cm RV S prime:     14.00 cm/s TAPSE (M-mode): 3.9 cm LEFT ATRIUM             Index       RIGHT ATRIUM           Index LA diam:        4.20 cm 2.13 cm/m  RA Area:  25.90 cm LA Vol (A2C):   48.3 ml 24.50 ml/m RA Volume:   94.10 ml  47.73 ml/m LA Vol (A4C):   56.6 ml 28.71 ml/m LA Biplane Vol: 55.4 ml 28.10 ml/m  AORTIC VALVE                   PULMONIC VALVE AV Area (Vmax):    3.57 cm    PV Vmax:        0.72 m/s AV Area (Vmean):   3.63 cm    PV Peak grad:   2.1 mmHg AV Area (VTI):     3.70 cm    RVOT Peak grad: 2 mmHg AV Vmax:           113.00 cm/s AV Vmean:          76.700 cm/s AV VTI:            0.232 m AV Peak Grad:      5.1 mmHg AV Mean Grad:      3.0 mmHg LVOT Vmax:         106.00 cm/s LVOT Vmean:        73.300 cm/s LVOT VTI:          0.226 m LVOT/AV VTI ratio: 0.97  AORTA Ao Root diam: 3.60 cm MITRAL VALVE               TRICUSPID VALVE MV Area (PHT): 3.30 cm    TR Peak grad:   18.7 mmHg MV  Decel Time: 230 msec    TR Vmax:        216.00 cm/s MV E velocity: 64.70 cm/s MV A velocity: 63.00 cm/s  SHUNTS MV E/A ratio:  1.03        Systemic VTI:  0.23 m                            Systemic Diam: 2.20 cm Serafina Royals MD Electronically signed by Serafina Royals MD Signature Date/Time: 08/11/2020/4:46:25 PM    Final        Subjective: No complaints, no new focal weaknes, slurred speech, confusion, serziures.  Discharge Exam: Vitals:   08/12/20 0738 08/12/20 1148  BP: 114/61 (!) 111/58  Pulse: 61 61  Resp: 14 16  Temp: 98.2 F (36.8 C) 98.5 F (36.9 C)  SpO2: 97% 99%   Vitals:   08/11/20 2334 08/12/20 0329 08/12/20 0738 08/12/20 1148  BP: (!) 100/48 (!) 118/59 114/61 (!) 111/58  Pulse: 70 65 61 61  Resp: 16 20 14 16   Temp: 98 F (36.7 C) 98.8 F (37.1 C) 98.2 F (36.8 C) 98.5 F (36.9 C)  TempSrc: Oral Oral Oral Oral  SpO2: 95% 99% 97% 99%  Weight:      Height:        General: Pt is alert, awake, not in acute distress, siting on side of bed eating breakfast Cardiovascular: RRR, nl S1-S2, no murmurs appreciated.   No LE edema.   Respiratory: Normal respiratory rate and rhythm.  CTAB without rales or wheezes. Abdominal: Abdomen soft and non-tender.  No distension or HSM.   Neuro/Psych: Strength reduced on right side, speech slurred. Judgment and insight appear norma.   The results of significant diagnostics from this hospitalization (including imaging, microbiology, ancillary and laboratory) are listed below for reference.     Microbiology: Recent Results (from the past 240 hour(s))  SARS CORONAVIRUS 2 (TAT 6-24 HRS) Nasopharyngeal Nasopharyngeal Swab  Status: None   Collection Time: 08/10/20 10:35 AM   Specimen: Nasopharyngeal Swab  Result Value Ref Range Status   SARS Coronavirus 2 NEGATIVE NEGATIVE Final    Comment: (NOTE) SARS-CoV-2 target nucleic acids are NOT DETECTED.  The SARS-CoV-2 RNA is generally detectable in upper and lower respiratory  specimens during the acute phase of infection. Negative results do not preclude SARS-CoV-2 infection, do not rule out co-infections with other pathogens, and should not be used as the sole basis for treatment or other patient management decisions. Negative results must be combined with clinical observations, patient history, and epidemiological information. The expected result is Negative.  Fact Sheet for Patients: SugarRoll.be  Fact Sheet for Healthcare Providers: https://www.woods-mathews.com/  This test is not yet approved or cleared by the [place] FDA and  has been authorized for detection and/or diagnosis of SARS-CoV-2 by FDA under an Emergency Use Authorization (EUA). This EUA will remain  in effect (meaning this test can be used) for the duration of the COVID-19 declaration under Se ction 564(b)(1) of the Act, 21 U.S.C. section 360bbb-3(b)(1), unless the authorization is terminated or revoked sooner.  Performed at [facility], [place] [address] [place], [place] [address]      Labs: BNP (last 3 results) No results for input(s): BNP in the last 8760 hours. Basic Metabolic Panel: Recent Labs  Lab 08/10/20 0914 08/11/20 0414 08/12/20 0421  NA 130* 131* 134*  K 3.3* 3.6 4.1  CL 95* 99 102  CO2 26 25 24   GLUCOSE 112* 84 91  BUN 5* 6* 7*  CREATININE 0.76 0.58* 0.74  CALCIUM 9.0 8.9 9.2  MG  --  1.8  --    Liver Function Tests: Recent Labs  Lab 08/10/20 0914 08/12/20 0421  AST 46* 27  ALT 36 24  ALKPHOS 73 61  BILITOT 0.7 2.0*  PROT 6.9 5.8*  ALBUMIN 3.6 3.3*   No results for input(s): LIPASE, AMYLASE in the last 168 hours. No results for input(s): AMMONIA in the last 168 hours. CBC: Recent Labs  Lab 08/10/20 0914 08/11/20 0414 08/12/20 0421  WBC 7.9 7.9 8.5  NEUTROABS 5.2  --   --   HGB 7.0* 7.6* 8.1*  HCT 26.5* 26.6* 28.4*  MCV 67.1* 68.7* 71.5*  PLT 372 325 299   Cardiac Enzymes: No results  for input(s): CKTOTAL, CKMB, CKMBINDEX, TROPONINI in the last 168 hours. BNP: Invalid input(s): POCBNP CBG: No results for input(s): GLUCAP in the last 168 hours. D-Dimer No results for input(s): DDIMER in the last 72 hours. Hgb A1c Recent Labs    08/11/20 0414  HGBA1C 5.1   Lipid Profile Recent Labs    08/11/20 0414  CHOL 84  HDL 24*  LDLCALC 46  TRIG 69  CHOLHDL 3.5   Thyroid function studies Recent Labs    08/11/20 0412  TSH 0.708   Anemia work up Recent Labs    08/11/20 0414 08/11/20 0907 08/11/20 1245  VITAMINB12  --  1,373*  --   FOLATE 11.2  --   --   FERRITIN 10*  --   --   TIBC 363  --   --   IRON 70  --   --   RETICCTPCT  --   --  1.3   Urinalysis    Component Value Date/Time   COLORURINE [place] 10/05/2011 2030   APPEARANCEUR Clear 10/05/2011 2030   LABSPEC 1.006 10/05/2011 2030   PHURINE 6.0 10/05/2011 2030   GLUCOSEU Negative 10/05/2011  2030   [place] Negative 10/05/2011 2030   [place] Negative 10/05/2011 2030   KETONESUR Negative 10/05/2011 2030   PROTEINUR Negative 10/05/2011 2030   NITRITE Negative 10/05/2011 2030   LEUKOCYTESUR Negative 10/05/2011 2030   Sepsis Labs Invalid input(s): PROCALCITONIN,  WBC,  LACTICIDVEN Microbiology Recent Results (from the past 240 hour(s))  SARS CORONAVIRUS 2 (TAT 6-24 HRS) Nasopharyngeal Nasopharyngeal Swab     Status: None   Collection Time: 08/10/20 10:35 AM   Specimen: Nasopharyngeal Swab  Result Value Ref Range Status   SARS Coronavirus 2 NEGATIVE NEGATIVE Final    Comment: (NOTE) SARS-CoV-2 target nucleic acids are NOT DETECTED.  The SARS-CoV-2 RNA is generally detectable in upper and lower respiratory specimens during the acute phase of infection. Negative results do not preclude SARS-CoV-2 infection, do not rule out co-infections with other pathogens, and should not be used as the sole basis for treatment or other patient management decisions. Negative results must be combined with  clinical observations, patient history, and epidemiological information. The expected result is Negative.  Fact Sheet for Patients: SugarRoll.be  Fact Sheet for Healthcare Providers: https://www.woods-mathews.com/  This test is not yet approved or cleared by the [place] FDA and  has been authorized for detection and/or diagnosis of SARS-CoV-2 by FDA under an Emergency Use Authorization (EUA). This EUA will remain  in effect (meaning this test can be used) for the duration of the COVID-19 declaration under Se ction 564(b)(1) of the Act, 21 U.S.C. section 360bbb-3(b)(1), unless the authorization is terminated or revoked sooner.  Performed at [facility], [place] [address] [place], [place] [address]      Time coordinating discharge: 25 minutes      SIGNED:   Edwin Dada, MD  [facility] 08/12/2020, 12:51 PM

## 2020-08-12 NOTE — Progress Notes (Signed)
Home med Zytiga (bottle) returned to patient and his wife, prior to discharge.

## 2020-08-13 LAB — TYPE AND SCREEN
ABO/RH(D): A POS
Antibody Screen: NEGATIVE
Unit division: 0
Unit division: 0

## 2020-08-13 LAB — BPAM RBC
Blood Product Expiration Date: 202204122359
Blood Product Expiration Date: 202204132359
ISSUE DATE / TIME: 202203171235
ISSUE DATE / TIME: 202203181539
Unit Type and Rh: 6200
Unit Type and Rh: 6200

## 2020-09-26 ENCOUNTER — Ambulatory Visit: Payer: No Typology Code available for payment source | Admitting: Gastroenterology

## 2023-09-24 ENCOUNTER — Encounter: Payer: Self-pay | Admitting: Podiatry

## 2023-09-24 ENCOUNTER — Ambulatory Visit (INDEPENDENT_AMBULATORY_CARE_PROVIDER_SITE_OTHER): Admitting: Podiatry

## 2023-09-24 VITALS — Ht 70.0 in | Wt 174.0 lb

## 2023-09-24 DIAGNOSIS — B351 Tinea unguium: Secondary | ICD-10-CM

## 2023-09-24 DIAGNOSIS — I70261 Atherosclerosis of native arteries of extremities with gangrene, right leg: Secondary | ICD-10-CM

## 2023-09-24 DIAGNOSIS — M869 Osteomyelitis, unspecified: Secondary | ICD-10-CM

## 2023-09-24 DIAGNOSIS — M79675 Pain in left toe(s): Secondary | ICD-10-CM

## 2023-09-24 DIAGNOSIS — M79674 Pain in right toe(s): Secondary | ICD-10-CM | POA: Diagnosis not present

## 2023-09-24 NOTE — Progress Notes (Signed)
 Subjective:  Patient ID: Mark Fleming, male    DOB: [birth year],  MRN: [id number]  Chief Complaint  Patient presents with   Toe Pain    No muscle control of right leg and foot. Right foot 3rd toe is dangling and 4th,and 5th toe necrosis with infection!!! Left foot nail trim    Discussed the use of AI scribe software for clinical note transcription with the patient, who gave verbal consent to proceed.  History of Present Illness Mark Fleming is a 68 year old male who presents today with a chief complaint of painful thickened elongated toenails.  He says they have been like this for some time.  He and his son who is present today state they have not looked in his feet in some time.  The foot is red and swollen, indicating cellulitis. He denies having diabetes or known circulation issues, but has a history of stroke, which may be relevant to his current vascular problems.  He does not regularly inspect his feet, contributing to the delayed recognition of the severity of his condition.  He smokes approximately half a pack of cigarettes per day, which may impact his vascular health and healing process.      Objective:    Physical Exam VASCULAR: Nonpalpable pulses bilaterally.  NEUROLOGIC: Reduced sensation to light touch and pressure. No paresthesias. ORTHOPEDIC: Smooth, pain-free range of motion of all examined joints. No ecchymosis, bruising, gross deformity, or pain to palpation. Dermatologic: Right foot is edematous and erythematous to the ankle with cellulitis. Dry gangrene of the right third toe and wet gangrene of the right fifth toe with malodor. Purulent exposed bone, friable distal nail and phalanx on right foot. Left foot third toe: no gangrene, thick elongated yellow dystrophic mycotic nails x5.        Results Procedure: Nail Debridement Description: Debrided the nails on the left foot (five nails) using a sharp nail nipper, reducing length and thickness to a  tolerable level. Procedure was well tolerated.  Procedure: Wound Dressing Application Description: Applied a dressing to the right foot and provided a post-operative surgical shoe.   Assessment:   1. 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 Toe osteomyelitis, right (HCC)   3. Pain due to onychomycosis of toenails of both feet      Plan:  Patient was evaluated and treated and all questions answered.  Assessment and Plan Assessment & Plan Gangrene of right foot Severe gangrene of the right foot with dry gangrene of the third toe and developing wet gangrene in the fifth toe. Complicated by cellulitis with malodor, purulent exposed bone, and friable distal nail and phalanx, extending to the ankle level. - I discussed the severity of the circulatory issue as well as the infection developing with the patient and his son and recommended admission to the hospital through the emergency room.  Discussed admission to [facility] where we are present, they declined and preferred to proceed to the [facility] [place].  Admit for surgical intervention to address gangrenous changes and infection. - Consult podiatry for surgical debridement and management. -Will need vascular surgery consultation - Apply dressing and provide post-op surgical shoe for protection.  Peripheral artery disease (PAD) Critical ischemia with PAD in the right foot, leading to gangrene and infection. Nonpalpable pulses bilaterally indicate severe vascular compromise. Urgent intervention required to address vascular issues and prevent further complications. Potential transmetatarsal amputation (TMA) or multiple digital amputations discussed with him and his son. - Admit  through the emergency room for urgent vascular evaluation and intervention. - Consult vascular surgery for evaluation of blood flow and circulation. - Discuss potential need for surgical intervention, including possible TMA or multiple digital  amputations.    Discussed the etiology and treatment options for the condition in detail with the patient. Recommended debridement of the nails today. Sharp and mechanical debridement performed of all painful and mycotic nails today. Nails debrided in length and thickness using a nail nipper to level of comfort.  This was only done on the left foot toes 1 through 5 today    No follow-ups on file.
